# Patient Record
Sex: Female | Born: 1985 | Race: White | Hispanic: No | Marital: Single | Smoking: Never smoker
Health system: Southern US, Community
[De-identification: ages and names within clinical notes are randomized; demographics above are authoritative.]

## PROBLEM LIST (undated history)

## (undated) DIAGNOSIS — F32A Depression, unspecified: Secondary | ICD-10-CM

## (undated) DIAGNOSIS — D279 Benign neoplasm of unspecified ovary: Secondary | ICD-10-CM

## (undated) DIAGNOSIS — F329 Major depressive disorder, single episode, unspecified: Secondary | ICD-10-CM

## (undated) DIAGNOSIS — E7800 Pure hypercholesterolemia, unspecified: Secondary | ICD-10-CM

## (undated) HISTORY — DX: Pure hypercholesterolemia, unspecified: E78.00

## (undated) HISTORY — DX: Benign neoplasm of unspecified ovary: D27.9

## (undated) HISTORY — PX: PELVIC LAPAROSCOPY: SHX162

## (undated) HISTORY — DX: Depression, unspecified: F32.A

## (undated) HISTORY — DX: Major depressive disorder, single episode, unspecified: F32.9

---
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
## 2006-09-05 ENCOUNTER — Other Ambulatory Visit: Admission: RE | Admit: 2006-09-05 | Discharge: 2006-09-05 | Payer: Self-pay | Admitting: Gynecology

## 2008-02-05 ENCOUNTER — Other Ambulatory Visit: Admission: RE | Admit: 2008-02-05 | Discharge: 2008-02-05 | Payer: Self-pay | Admitting: Gynecology

## 2009-03-22 ENCOUNTER — Encounter: Payer: Self-pay | Admitting: Gynecology

## 2009-03-22 ENCOUNTER — Ambulatory Visit: Payer: Self-pay | Admitting: Gynecology

## 2009-03-22 ENCOUNTER — Other Ambulatory Visit: Admission: RE | Admit: 2009-03-22 | Discharge: 2009-03-22 | Payer: Self-pay | Admitting: Gynecology

## 2009-04-07 ENCOUNTER — Encounter: Admission: RE | Admit: 2009-04-07 | Discharge: 2009-04-07 | Payer: Self-pay | Admitting: Family Medicine

## 2010-04-11 ENCOUNTER — Other Ambulatory Visit: Admission: RE | Admit: 2010-04-11 | Discharge: 2010-04-11 | Payer: Self-pay | Admitting: Gynecology

## 2010-04-11 ENCOUNTER — Ambulatory Visit: Payer: Self-pay | Admitting: Gynecology

## 2010-04-12 ENCOUNTER — Ambulatory Visit: Payer: Self-pay | Admitting: Gynecology

## 2011-04-19 NOTE — H&P (Signed)
NAME:  Tiffany Mills, Tiffany Mills                        ACCOUNT NO.:  [id number]   MEDICAL RECORD NO.:  [id number]                   PATIENT TYPE:  AMB   LOCATION:  DAY                                  FACILITY:  [facility]   PHYSICIAN:  Juan H. Lily Peer, M.D.             DATE OF BIRTH:  Aug 07, 1986   DATE OF ADMISSION:  DATE OF DISCHARGE:                                HISTORY & PHYSICAL   CHIEF COMPLAINT:  Persistent right adnexal mass.   HISTORY OF PRESENT ILLNESS:  The patient is a 25 year old gravida 0, who had  been seen in [facility] on December 22, 2002, as a second  opinion, but decided later to stay with our practice.  She had been  evaluated first by her family practitioner at [facility]  due to the fact the patient had ongoing chronic anovulation with having  periods one to two per year and with some hair growth, and making one  suspicious for underlying polycystic ovarian disease.  Her workup at that  point had consisted of a slightly elevated SGOT, SGPT, cholesterol, and  triglycerides.  She had in a fasting state repeated on October 19, 2002.  Her __________ and glucose were also elevated.  Her FSH and LH were normal.  She had a pelvic ultrasound which demonstrated at that point that she had a  3 cm cyst in the right ovary, the left ovary was normal.  The ultrasound of  the abdomen had demonstrated some findings consistent with fatty  infiltration versus diffuse hepatocellular disease can not be ruled out.  She subsequently had seen Dr. Randa Evens, gastroenterologist, who did not find  any contraindications for her mild fatty liver disease to put her on oral  contraceptive pill in an effort to regulate her cycle, and perhaps help  decrease this cyst and break the ongoing cycle for hirsutism.  She had been  referred to another gynecologist in the community where by he had initiated  some studies to include 17-hydroxyprogesterone which was reported to  be in  the upper limits of normal range. TSH and prolactin had been normal.  Her  DHEA had been reported to be normal.  She had been started on Metformin 500  mg b.i.d. which currently her sugars are being monitored by her family  practice doctor.  Initially, she had an elevated CA-125 which had been 41.7,  and the most recent one on February 07, 2003, had decreased to a normal range of  23.1.  Her cysts were followed in our office in May and June 2004,  respectively, with the last ultrasound demonstrating a normal sized uterus,  the right adnexa rim of the ovarian tissue measuring 3.3 x 3.2 x 2.6 cm with  multiple follicles, less then 4 mm, a thin walled cyst measuring 4.1 x 4.0 x  3.6 cm with bright echogenic focus wall of the cyst measuring 9 x 3 mm.  There was negative color flow Doppler.  The inferior wall of the cystic mass  was a 21 x 13 x 15 mm irregularly shaped echogenic focus with shadowing,  again negative for color flow Doppler.  The left ovary was normal.  The cul-  de-sac was negative.  The working diagnosis was possible dermoid cyst versus  an endometrioma.   PAST MEDICAL HISTORY:  1. Polycystic ovarian disease.  2. Type 2, possibly type 1 diabetes.  3. Fatty liver.  4. Chronic anovulation.   ALLERGIES:  No known drug allergies.   Menarche started at age 75.   FAMILY HISTORY:  Grandmother with a history of diabetes as well as colon  cancer.  Two aunts with breast cancer.   PHYSICAL EXAMINATION:  VITAL SIGNS:  The patient is 5 feet 4.5 inches tall,  214 pounds.  Blood pressure 108/62.  GENERAL:  A well-developed, well-nourished female.  HEENT:  Unremarkable.  NECK:  Supple, trachea midline, no carotid bruits, no thyromegaly.  LUNGS:  Clear to auscultation without rhonchi or wheezes.  HEART:  Regular rate and rhythm.  No murmurs or gallops.  BREASTS:  Breast examination not done.  ABDOMEN:  Soft, nontender, without rebound or guarding.  PELVIC:  Not done due to  findings on ultrasound were sufficient.   ASSESSMENT:  A 24 year old gravida 0, with persistent right ovarian cyst,  history of chronic anovulation/polycystic ovarian disease/insulin  resistance.  Currently on Metformin 500 mg b.i.d.  Mild history of fatty  liver, followed by Dr. Randa Evens, no contraindications to hormonal regulation  of her menses.  She had been started on Ortho-Evra transdermal patch to  bypass the liver initially.  The patient sustained and has had regular  cycles at this point, but her cyst has remained persistent.   The patient is scheduled to undergo diagnostic laparoscopy with possible  right ovarian cystectomy, possible right salpingo-oophorectomy, possible  right oophorectomy.  The risks and benefits of the procedure were discussed  with the patient and her parents who were both present during the office  visit, to include infection, bleeding requiring blood transfusion with a  potential risk of anaphylactic reaction, hepatitis, and AIDS, the risk of  trauma to internal organs requiring open laparotomy or also in the event of  inaccessibility or difficulty in gaining access in completing the operation  laparoscopically, and open laparotomy technique could need to be utilized  which would require additional hospitalization dates.  She will receive  prophylaxis antibiotics.  We will continue to monitor her sugars.  All these  issues above we discussed with the patient.  In the event that this is a  malignancy, we will proceed with an unilateral adnexectomy and then refer  her to a gynecological oncologist for definitive treatment.  All of these  issues were discussed with the patient and her parents, and all questions  were answered.  We will follow accordingly.   PLAN:  As per assessment above.                                               Juan H. Lily Peer, M.D.    JHF/MEDQ  D:  05/09/2003  T:  05/09/2003  Job:  [id number]

## 2011-04-19 NOTE — Op Note (Signed)
NAME:  Tiffany Mills, Tiffany Mills                        ACCOUNT NO.:  [id number]   MEDICAL RECORD NO.:  [id number]                   PATIENT TYPE:  AMB   LOCATION:  DAY                                  FACILITY:  [facility]   PHYSICIAN:  Juan H. Lily Peer, M.D.             DATE OF BIRTH:  27-Oct-1986   DATE OF PROCEDURE:  05/10/2003  DATE OF DISCHARGE:                                 OPERATIVE REPORT   SURGEON:  Juan H. Lily Peer, M.D.   FIRST ASSISTANT:  Timothy P. Fontaine, M.D.   INDICATIONS FOR PROCEDURE:  A 25 year old with persistent right adnexal  mass.   PREOPERATIVE DIAGNOSIS:  Right pelvic mass.   POSTOPERATIVE DIAGNOSIS:  Right pelvic mass.   ANESTHESIA:  General endotracheal anesthesia.   PROCEDURE:  Laparoscopic right ovarian cystectomy, partial oophorectomy,  pelvic washings.   FINDINGS:  A 4 cm smooth right ovarian cyst, papillations were noted inside  the cyst wall. Normal tube and left ovary and normal pelvis.   DESCRIPTION OF PROCEDURE:  After the patient was adequately counseled, she  was taken to the operating room where she underwent a successful general  endotracheal anesthesia. A Foley catheter was inserted in an effort to  monitor urinary output. A Hulka tenaculum was placed in an effort to  manipulate the uterus during the laparoscopic procedure. After the drapes  were in place, a small stab incision was made in the infraumbilical region  followed by insertion of the Veress needle and subsequently by insertion of  the trocar, the trocar was removed. The laparoscopic sleeve was left in  place and the 10 mm scope was inserted. Two separate 5 mm trocar sites were  made approximately 4 fingerbreadths to the midline under laparoscopic  guidance. A systematic inspection of the pelvic cavity demonstrated smooth  peritoneum, normal appearing uterus, left tube and ovary, right tube was  normal. A 4 cm smooth large ovarian cyst was noted. Also the cul-de-sac was  smooth  and normal and no evidence of any adhesions or endometriotic  implants. At this point in the operation, the ovary was placed under tension  and a needle was inserted into the cyst and the cyst contents were aspirated  and sent for cytologic evaluation. Also pelvic washings had previously been  obtained and submitted for cytological evaluation as well. Once the cyst  wall collapsed, the cyst wall was incised into the capsule and it was noted  at this point to some excrescence were noted in the inferior portion of the  inside ovary. A partial oophorectomy was accomplished. The cyst was  difficult to separate from the cyst wall. This was submitted for frozen. The  preliminary report was possible fibroma and possible borderline tumor but no  clear cut evidence of malignancy according to Dr. Army Melia, pathologist, who  gave a preliminary report. Additional portions of the ovary were removed  leaving a small portion of right cortex left in  place. The base was  cauterized, the pelvic cavity was copiously irrigated with normal saline  solution. The pneumoperitoneum was removed and the instruments were removed.  The subumbilical 10 mm trocar site fascia was closed with a 3-0 Vicryl  suture and the skin was reapproximated with a subcuticular stitch of 4-0  plain catgut suture. Both 5 mm trocar sites on the skin was reapproximated  with 4-0 plain catgut suture. For postoperative analgesia,  0.25% Marcaine was infiltrated into all three incision sites for a total of  10 mL. The Hulka tenaculum was removed. She did receive 1 g of Cefotan  prophylactically and the Foley catheter was removed. Urine output at the end  of the case was 275 mL and EBL was less than 100 mL and IV fluids was 1250  mL.                                               Juan H. Lily Peer, M.D.    JHF/MEDQ  D:  05/10/2003  T:  05/10/2003  Job:  [id number]

## 2011-05-21 ENCOUNTER — Encounter (INDEPENDENT_AMBULATORY_CARE_PROVIDER_SITE_OTHER): Payer: Managed Care, Other (non HMO) | Admitting: Gynecology

## 2011-05-21 ENCOUNTER — Other Ambulatory Visit (HOSPITAL_COMMUNITY)
Admission: RE | Admit: 2011-05-21 | Discharge: 2011-05-21 | Disposition: A | Discharge status: Home / Self Care | Payer: Managed Care, Other (non HMO) | Source: Ambulatory Visit | Attending: Gynecology | Admitting: Gynecology

## 2011-05-21 ENCOUNTER — Other Ambulatory Visit: Payer: Self-pay | Admitting: Gynecology

## 2011-05-21 DIAGNOSIS — Z124 Encounter for screening for malignant neoplasm of cervix: Secondary | ICD-10-CM | POA: Insufficient documentation

## 2011-05-21 DIAGNOSIS — R635 Abnormal weight gain: Secondary | ICD-10-CM

## 2011-05-21 DIAGNOSIS — N83209 Unspecified ovarian cyst, unspecified side: Secondary | ICD-10-CM

## 2011-05-21 DIAGNOSIS — R82998 Other abnormal findings in urine: Secondary | ICD-10-CM

## 2011-05-21 DIAGNOSIS — Z01419 Encounter for gynecological examination (general) (routine) without abnormal findings: Secondary | ICD-10-CM

## 2012-05-06 ENCOUNTER — Telehealth: Payer: Self-pay | Admitting: *Deleted

## 2012-05-06 DIAGNOSIS — Z87898 Personal history of other specified conditions: Secondary | ICD-10-CM

## 2012-05-06 MED ORDER — NORGESTIMATE-ETH ESTRADIOL 0.25-35 MG-MCG PO TABS: 1.0000 | ORAL_TABLET | Freq: Every day | ORAL | Status: DC

## 2012-05-06 NOTE — Telephone Encounter (Signed)
Per JF verbral order okay for pt to have ultrasound done, rx will be sent to pharmacy for 1 pack of pills

## 2012-05-06 NOTE — Telephone Encounter (Signed)
Pt has annual scheduled on July 22, she would like know if ultrasound will be done this year as well? Pt said the last couple of annual had an ultrasound done due right ovarian tumor. Please advise

## 2012-06-22 ENCOUNTER — Encounter: Payer: Managed Care, Other (non HMO) | Admitting: Gynecology

## 2012-06-24 ENCOUNTER — Encounter: Payer: Self-pay | Admitting: *Deleted

## 2012-06-24 ENCOUNTER — Other Ambulatory Visit: Payer: Managed Care, Other (non HMO)

## 2012-06-24 ENCOUNTER — Encounter: Payer: Managed Care, Other (non HMO) | Admitting: Gynecology

## 2012-06-24 NOTE — Progress Notes (Signed)
Patient ID: Tiffany Mills, female   DOB: Dec 24, 1985, 26 y.o.   MRN: [id number] PT CALLED ASKING IF OV TODAY WITH U/S SHOULD BE RESCHEDULED DUE TO CYCLE STARTING TODAY. LEFT MESSAGE ON PT VOICEMAIL THE DECISION IS UP TO HER, PAP MAY NOT BE DONE DUE TO BLEEDING. TOLD PT TO CALL IF RESCHEDULE.

## 2012-06-25 ENCOUNTER — Other Ambulatory Visit: Payer: Managed Care, Other (non HMO)

## 2012-06-25 ENCOUNTER — Encounter: Payer: Managed Care, Other (non HMO) | Admitting: Gynecology

## 2012-07-05 ENCOUNTER — Other Ambulatory Visit: Payer: Self-pay | Admitting: Gynecology

## 2012-07-09 ENCOUNTER — Ambulatory Visit (INDEPENDENT_AMBULATORY_CARE_PROVIDER_SITE_OTHER): Payer: Commercial Managed Care - PPO

## 2012-07-09 ENCOUNTER — Ambulatory Visit (INDEPENDENT_AMBULATORY_CARE_PROVIDER_SITE_OTHER): Payer: Commercial Managed Care - PPO | Admitting: Gynecology

## 2012-07-09 ENCOUNTER — Encounter: Payer: Self-pay | Admitting: Gynecology

## 2012-07-09 VITALS — BP 120/86 | Ht 64.0 in | Wt 241.0 lb

## 2012-07-09 DIAGNOSIS — Z87898 Personal history of other specified conditions: Secondary | ICD-10-CM

## 2012-07-09 DIAGNOSIS — D279 Benign neoplasm of unspecified ovary: Secondary | ICD-10-CM

## 2012-07-09 DIAGNOSIS — Z01419 Encounter for gynecological examination (general) (routine) without abnormal findings: Secondary | ICD-10-CM

## 2012-07-09 DIAGNOSIS — I10 Essential (primary) hypertension: Secondary | ICD-10-CM | POA: Insufficient documentation

## 2012-07-09 DIAGNOSIS — E7800 Pure hypercholesterolemia, unspecified: Secondary | ICD-10-CM | POA: Insufficient documentation

## 2012-07-09 DIAGNOSIS — Z309 Encounter for contraceptive management, unspecified: Secondary | ICD-10-CM

## 2012-07-09 DIAGNOSIS — N8300 Follicular cyst of ovary, unspecified side: Secondary | ICD-10-CM

## 2012-07-09 DIAGNOSIS — E119 Type 2 diabetes mellitus without complications: Secondary | ICD-10-CM | POA: Insufficient documentation

## 2012-07-09 DIAGNOSIS — R638 Other symptoms and signs concerning food and fluid intake: Secondary | ICD-10-CM

## 2012-07-09 MED ORDER — NORGESTIMATE-ETH ESTRADIOL 0.25-35 MG-MCG PO TABS: 1.0000 | ORAL_TABLET | Freq: Every day | ORAL | Status: DC

## 2012-07-09 NOTE — Patient Instructions (Addendum)
Health Maintenance, Females A healthy lifestyle and preventative care can promote health and wellness.  Maintain regular health, dental, and eye exams.   Eat a healthy diet. Foods like vegetables, fruits, whole grains, low-fat dairy products, and lean protein foods contain the nutrients you need without too many calories. Decrease your intake of foods high in solid fats, added sugars, and salt. Get information about a proper diet from your caregiver, if necessary.   Regular physical exercise is one of the most important things you can do for your health. Most adults should get at least 150 minutes of moderate-intensity exercise (any activity that increases your heart rate and causes you to sweat) each week. In addition, most adults need muscle-strengthening exercises on 2 or more days a week.    Maintain a healthy weight. The body mass index (BMI) is a screening tool to identify possible weight problems. It provides an estimate of body fat based on height and weight. Your caregiver can help determine your BMI, and can help you achieve or maintain a healthy weight. For adults 20 years and older:   A BMI below 18.5 is considered underweight.   A BMI of 18.5 to 24.9 is normal.   A BMI of 25 to 29.9 is considered overweight.   A BMI of 30 and above is considered obese.   Maintain normal blood lipids and cholesterol by exercising and minimizing your intake of saturated fat. Eat a balanced diet with plenty of fruits and vegetables. Blood tests for lipids and cholesterol should begin at age 20 and be repeated every 5 years. If your lipid or cholesterol levels are high, you are over 50, or you are a high risk for heart disease, you may need your cholesterol levels checked more frequently.Ongoing high lipid and cholesterol levels should be treated with medicines if diet and exercise are not effective.   If you smoke, find out from your caregiver how to quit. If you do not use tobacco, do not start.    If you are pregnant, do not drink alcohol. If you are breastfeeding, be very cautious about drinking alcohol. If you are not pregnant and choose to drink alcohol, do not exceed 1 drink per day. One drink is considered to be 12 ounces (355 mL) of beer, 5 ounces (148 mL) of wine, or 1.5 ounces (44 mL) of liquor.   Avoid use of street drugs. Do not share needles with anyone. Ask for help if you need support or instructions about stopping the use of drugs.   High blood pressure causes heart disease and increases the risk of stroke. Blood pressure should be checked at least every 1 to 2 years. Ongoing high blood pressure should be treated with medicines, if weight loss and exercise are not effective.   If you are 55 to 25 years old, ask your caregiver if you should take aspirin to prevent strokes.   Diabetes screening involves taking a blood sample to check your fasting blood sugar level. This should be done once every 3 years, after age 45, if you are within normal weight and without risk factors for diabetes. Testing should be considered at a younger age or be carried out more frequently if you are overweight and have at least 1 risk factor for diabetes.   Breast cancer screening is essential preventative care for women. You should practice "breast self-awareness." This means understanding the normal appearance and feel of your breasts and may include breast self-examination. Any changes detected, no matter how   small, should be reported to a caregiver. Women in their 20s and 30s should have a clinical breast exam (CBE) by a caregiver as part of a regular health exam every 1 to 3 years. After age 40, women should have a CBE every year. Starting at age 40, women should consider having a mammogram (breast X-ray) every year. Women who have a family history of breast cancer should talk to their caregiver about genetic screening. Women at a high risk of breast cancer should talk to their caregiver about having  an MRI and a mammogram every year.   The Pap test is a screening test for cervical cancer. Women should have a Pap test starting at age 21. Between ages 21 and 29, Pap tests should be repeated every 2 years. Beginning at age 30, you should have a Pap test every 3 years as long as the past 3 Pap tests have been normal. If you had a hysterectomy for a problem that was not cancer or a condition that could lead to cancer, then you no longer need Pap tests. If you are between ages 65 and 70, and you have had normal Pap tests going back 10 years, you no longer need Pap tests. If you have had past treatment for cervical cancer or a condition that could lead to cancer, you need Pap tests and screening for cancer for at least 20 years after your treatment. If Pap tests have been discontinued, risk factors (such as a new sexual partner) need to be reassessed to determine if screening should be resumed. Some women have medical problems that increase the chance of getting cervical cancer. In these cases, your caregiver may recommend more frequent screening and Pap tests.   The human papillomavirus (HPV) test is an additional test that may be used for cervical cancer screening. The HPV test looks for the virus that can cause the cell changes on the cervix. The cells collected during the Pap test can be tested for HPV. The HPV test could be used to screen women aged 30 years and older, and should be used in women of any age who have unclear Pap test results. After the age of 30, women should have HPV testing at the same frequency as a Pap test.   Colorectal cancer can be detected and often prevented. Most routine colorectal cancer screening begins at the age of 50 and continues through age 75. However, your caregiver may recommend screening at an earlier age if you have risk factors for colon cancer. On a yearly basis, your caregiver may provide home test kits to check for hidden blood in the stool. Use of a small camera at  the end of a tube, to directly examine the colon (sigmoidoscopy or colonoscopy), can detect the earliest forms of colorectal cancer. Talk to your caregiver about this at age 50, when routine screening begins. Direct examination of the colon should be repeated every 5 to 10 years through age 75, unless early forms of pre-cancerous polyps or small growths are found.   Hepatitis C blood testing is recommended for all people born from 1945 through 1965 and any individual with known risks for hepatitis C.   Practice safe sex. Use condoms and avoid high-risk sexual practices to reduce the spread of sexually transmitted infections (STIs). Sexually active women aged 25 and younger should be checked for Chlamydia, which is a common sexually transmitted infection. Older women with new or multiple partners should also be tested for Chlamydia. Testing for other   STIs is recommended if you are sexually active and at increased risk.   Osteoporosis is a disease in which the bones lose minerals and strength with aging. This can result in serious bone fractures. The risk of osteoporosis can be identified using a bone density scan. Women ages 65 and over and women at risk for fractures or osteoporosis should discuss screening with their caregivers. Ask your caregiver whether you should be taking a calcium supplement or vitamin D to reduce the rate of osteoporosis.   Menopause can be associated with physical symptoms and risks. Hormone replacement therapy is available to decrease symptoms and risks. You should talk to your caregiver about whether hormone replacement therapy is right for you.   Use sunscreen with a sun protection factor (SPF) of 30 or greater. Apply sunscreen liberally and repeatedly throughout the day. You should seek shade when your shadow is shorter than you. Protect yourself by wearing long sleeves, pants, a wide-brimmed hat, and sunglasses year round, whenever you are outdoors.   Notify your caregiver  of new moles or changes in moles, especially if there is a change in shape or color. Also notify your caregiver if a mole is larger than the size of a pencil eraser.   Stay current with your immunizations.  Document Released: 06/03/2011 Document Revised: 11/07/2011 Document Reviewed: 06/03/2011 [employer] Patient Information 2012 [employer].                                                   Cholesterol Control Diet  Cholesterol levels in your body are determined significantly by your diet. Cholesterol levels may also be related to heart disease. The following material helps to explain this relationship and discusses what you can do to help keep your heart healthy. Not all cholesterol is bad. Low-density lipoprotein (LDL) cholesterol is the "bad" cholesterol. It may cause fatty deposits to build up inside your arteries. High-density lipoprotein (HDL) cholesterol is "good." It helps to remove the "bad" LDL cholesterol from your blood. Cholesterol is a very important risk factor for heart disease. Other risk factors are high blood pressure, smoking, stress, heredity, and weight. The heart muscle gets its supply of blood through the coronary arteries. If your LDL cholesterol is high and your HDL cholesterol is low, you are at risk for having fatty deposits build up in your coronary arteries. This leaves less room through which blood can flow. Without sufficient blood and oxygen, the heart muscle cannot function properly and you may feel chest pains (angina pectoris). When a coronary artery closes up entirely, a part of the heart muscle may die, causing a heart attack (myocardial infarction). CHECKING CHOLESTEROL When your caregiver sends your blood to a lab to be analyzed for cholesterol, a complete lipid (fat) profile may be done. With this test, the total amount of cholesterol and levels of LDL and HDL are determined. Triglycerides are a type of fat that circulates in the blood and can also be used to  determine heart disease risk. The list below describes what the numbers should be: Test: Total Cholesterol.  Less than 200 mg/dl.  Test: LDL "bad cholesterol."  Less than 100 mg/dl.   Less than 70 mg/dl if you are at very high risk of a heart attack or sudden cardiac death.  Test: HDL "good cholesterol."  Greater than 50 mg/dl for   women.   Greater than 40 mg/dl for men.  Test: Triglycerides.  Less than 150 mg/dl.  CONTROLLING CHOLESTEROL WITH DIET Although exercise and lifestyle factors are important, your diet is key. That is because certain foods are known to raise cholesterol and others to lower it. The goal is to balance foods for their effect on cholesterol and more importantly, to replace saturated and trans fat with other types of fat, such as monounsaturated fat, polyunsaturated fat, and omega-3 fatty acids. On average, a person should consume no more than 15 to 17 g of saturated fat daily. Saturated and trans fats are considered "bad" fats, and they will raise LDL cholesterol. Saturated fats are primarily found in animal products such as meats, butter, and cream. However, that does not mean you need to sacrifice all your favorite foods. Today, there are good tasting, low-fat, low-cholesterol substitutes for most of the things you like to eat. Choose low-fat or nonfat alternatives. Choose round or loin cuts of red meat, since these types of cuts are lowest in fat and cholesterol. Chicken (without the skin), fish, veal, and ground turkey breast are excellent choices. Eliminate fatty meats, such as hot dogs and salami. Even shellfish have little or no saturated fat. Have a 3 oz (85 g) portion when you eat lean meat, poultry, or fish. Trans fats are also called "partially hydrogenated oils." They are oils that have been scientifically manipulated so that they are solid at room temperature resulting in a longer shelf life and improved taste and texture of foods in which they are added. Trans  fats are found in stick margarine, some tub margarines, cookies, crackers, and baked goods.  When baking and cooking, oils are an excellent substitute for butter. The monounsaturated oils are especially beneficial since it is believed they lower LDL and raise HDL. The oils you should avoid entirely are saturated tropical oils, such as coconut and palm.  Remember to eat liberally from food groups that are naturally free of saturated and trans fat, including fish, fruit, vegetables, beans, grains (barley, rice, couscous, bulgur wheat), and pasta (without cream sauces).  IDENTIFYING FOODS THAT LOWER CHOLESTEROL  Soluble fiber may lower your cholesterol. This type of fiber is found in fruits such as apples, vegetables such as broccoli, potatoes, and carrots, legumes such as beans, peas, and lentils, and grains such as barley. Foods fortified with plant sterols (phytosterol) may also lower cholesterol. You should eat at least 2 g per day of these foods for a cholesterol lowering effect.  Read package labels to identify low-saturated fats, trans fats free, and low-fat foods at the supermarket. Select cheeses that have only 2 to 3 g saturated fat per ounce. Use a heart-healthy tub margarine that is free of trans fats or partially hydrogenated oil. When buying baked goods (cookies, crackers), avoid partially hydrogenated oils. Breads and muffins should be made from whole grains (whole-wheat or whole oat flour, instead of "flour" or "enriched flour"). Buy non-creamy canned soups with reduced salt and no added fats.  FOOD PREPARATION TECHNIQUES  Never deep-fry. If you must fry, either stir-fry, which uses very little fat, or use non-stick cooking sprays. When possible, broil, bake, or roast meats, and steam vegetables. Instead of dressing vegetables with butter or margarine, use lemon and herbs, applesauce and cinnamon (for squash and sweet potatoes), nonfat yogurt, salsa, and low-fat dressings for salads.    LOW-SATURATED FAT / LOW-FAT FOOD SUBSTITUTES Meats / Saturated Fat (g)  Avoid: Steak, marbled (3 oz/85 g) /   11 g   Choose: Steak, lean (3 oz/85 g) / 4 g   Avoid: Hamburger (3 oz/85 g) / 7 g   Choose: Hamburger, lean (3 oz/85 g) / 5 g   Avoid: Ham (3 oz/85 g) / 6 g   Choose: Ham, lean cut (3 oz/85 g) / 2.4 g   Avoid: Chicken, with skin, dark meat (3 oz/85 g) / 4 g   Choose: Chicken, skin removed, dark meat (3 oz/85 g) / 2 g   Avoid: Chicken, with skin, light meat (3 oz/85 g) / 2.5 g   Choose: Chicken, skin removed, light meat (3 oz/85 g) / 1 g  Dairy / Saturated Fat (g)  Avoid: Whole milk (1 cup) / 5 g   Choose: Low-fat milk, 2% (1 cup) / 3 g   Choose: Low-fat milk, 1% (1 cup) / 1.5 g   Choose: Skim milk (1 cup) / 0.3 g   Avoid: Hard cheese (1 oz/28 g) / 6 g   Choose: Skim milk cheese (1 oz/28 g) / 2 to 3 g   Avoid: Cottage cheese, 4% fat (1 cup) / 6.5 g   Choose: Low-fat cottage cheese, 1% fat (1 cup) / 1.5 g   Avoid: Ice cream (1 cup) / 9 g   Choose: Sherbet (1 cup) / 2.5 g   Choose: Nonfat frozen yogurt (1 cup) / 0.3 g   Choose: Frozen fruit bar / trace   Avoid: Whipped cream (1 tbs) / 3.5 g   Choose: Nondairy whipped topping (1 tbs) / 1 g  Condiments / Saturated Fat (g)  Avoid: Mayonnaise (1 tbs) / 2 g   Choose: Low-fat mayonnaise (1 tbs) / 1 g   Avoid: Butter (1 tbs) / 7 g   Choose: Extra light margarine (1 tbs) / 1 g   Avoid: Coconut oil (1 tbs) / 11.8 g   Choose: Olive oil (1 tbs) / 1.8 g   Choose: Corn oil (1 tbs) / 1.7 g   Choose: Safflower oil (1 tbs) / 1.2 g   Choose: Sunflower oil (1 tbs) / 1.4 g   Choose: Soybean oil (1 tbs) / 2.4 g   Choose: Canola oil (1 tbs) / 1 g  Document Released: 11/18/2005 Document Revised: 07/31/2011 Document Reviewed: 05/09/2011 [employer] Patient Information 2012 [employer].  Exercise to Lose Weight Exercise and a healthy diet may help you lose weight. Your doctor may suggest specific  exercises. EXERCISE IDEAS AND TIPS  Choose low-cost things you enjoy doing, such as walking, bicycling, or exercising to workout videos.   Take stairs instead of the elevator.   Walk during your lunch break.   Park your car further away from work or school.   Go to a gym or an exercise class.   Start with 5 to 10 minutes of exercise each day. Build up to 30 minutes of exercise 4 to 6 days a week.   Wear shoes with good support and comfortable clothes.   Stretch before and after working out.   Work out until you breathe harder and your heart beats faster.   Drink extra water when you exercise.   Do not do so much that you hurt yourself, feel dizzy, or get very short of breath.  Exercises that burn about 150 calories:  Running 1  miles in 15 minutes.   Playing volleyball for 45 to 60 minutes.   Washing and waxing a car for 45 to 60 minutes.   Playing touch football   for 45 minutes.   Walking 1  miles in 35 minutes.   Pushing a stroller 1  miles in 30 minutes.   Playing basketball for 30 minutes.   Raking leaves for 30 minutes.   Bicycling 5 miles in 30 minutes.   Walking 2 miles in 30 minutes.   Dancing for 30 minutes.   Shoveling snow for 15 minutes.   Swimming laps for 20 minutes.   Walking up stairs for 15 minutes.   Bicycling 4 miles in 15 minutes.   Gardening for 30 to 45 minutes.   Jumping rope for 15 minutes.   Washing windows or floors for 45 to 60 minutes.  Document Released: 12/21/2010 Document Revised: 07/31/2011 Document Reviewed: 12/21/2010 [employer] Patient Information 2012 [employer].  

## 2012-07-09 NOTE — Progress Notes (Signed)
Tiffany Mills Apr 03, 1986 [id number]   History:    26 y.o.  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 She's currently on Sprintec 28 day oral contraceptive pill. She's has been followed at [facility] for type 2 diabetes, hypercholesterolemia, and hypertension. She reports her cycles are regular and she frequently does her self breast examination and has had not had the Gardasil Vaccine. She has lost 6 pounds is last year but her BMI is still 41.3. She states she started to exercise more regularly. She's currently not sexually active.  Ultrasound today: Uterus measures 7.3 x 4.6 x 3.0 cm with a 6.4 mm endometrial stripe. Right ovary normal with several follicles left ovary dominant follicle echo-free measuring 26 x 21 x 20 mm (patient with a 14 her cycle) no apparent masses seen on right or left adnexa.  Past medical history,surgical history, family history and social history were all reviewed and documented in the EPIC chart.  Gynecologic History Patient's last menstrual period was 06/24/2012. Contraception: OCP (estrogen/progesterone) Last Pap: 2012. Results were: normal Last mammogram: Not indicated. Results were: Not indicated  Obstetric History OB History    Grav Para Term Preterm Abortions TAB SAB Ect Mult Living   0                ROS: A ROS was performed and pertinent positives and negatives are included in the history.  GENERAL: No fevers or chills. HEENT: No change in vision, no earache, sore throat or sinus congestion. NECK: No pain or stiffness. CARDIOVASCULAR: No chest pain or pressure. No palpitations. PULMONARY: No shortness of breath,  cough or wheeze. GASTROINTESTINAL: No abdominal pain, nausea, vomiting or diarrhea, melena or bright red blood per rectum. GENITOURINARY: No urinary frequency, urgency, hesitancy or dysuria. MUSCULOSKELETAL: No joint or muscle pain, no back pain, no recent trauma. DERMATOLOGIC: No rash, no itching, no lesions. ENDOCRINE: No polyuria, polydipsia, no heat or cold intolerance. No recent change in weight. HEMATOLOGICAL: No anemia or easy bruising or bleeding. NEUROLOGIC: No headache, seizures, numbness, tingling or weakness. PSYCHIATRIC: No depression, no loss of interest in normal activity or change in sleep pattern.     Exam: chaperone present  BP 120/86  Ht 5\' 4"  (1.626 m)  Wt 241 lb (109.317 kg)  BMI 41.37 kg/m2  LMP 06/24/2012  Body mass index is 41.37 kg/(m^2).  General appearance : Well developed well nourished female. No acute distress HEENT: Neck supple, trachea midline, no carotid bruits, no thyroidmegaly Lungs: Clear to auscultation, no rhonchi or wheezes, or rib retractions  Heart: Regular rate and rhythm, no murmurs or gallops Breast:Examined in sitting and supine position were symmetrical in appearance, no palpable masses or tenderness,  no skin retraction, no nipple inversion, no nipple discharge, no skin discoloration, no axillary or supraclavicular lymphadenopathy Abdomen: no palpable masses or tenderness, no rebound or guarding Extremities: no edema or skin discoloration or tenderness  Pelvic:  Bartholin, Urethra, Skene Glands: Within normal limits             Vagina: No gross lesions or discharge  Cervix: No gross lesions or discharge  Uterus  anteverted, normal size, shape and consistency, non-tender and mobile  Adnexa  Without masses  or tenderness  Anus and perineum  normal   Rectovaginal  normal sphincter tone without palpated masses or tenderness             Hemoccult not done     Assessment/Plan:  26 y.o. female for annual exam  With past history of right ovarian  SEROUS CYSTADENOFIBROMA in 2004 with no evidence of recurrence. Patient had a normal ultrasound today. She will stop by the lab to check a CA 125. We did discuss the new Pap smear screening guidelines and she would not need a Pap smear this year. Patient not sexually active. She was encouraged to do her monthly self breast examination. Literature information on cholesterol-lowering diet was provided along with an exercise protocol.    Ok Edwards MD, 3:39 PM 07/09/2012

## 2013-06-02 ENCOUNTER — Other Ambulatory Visit: Payer: Self-pay | Admitting: Gynecology

## 2013-06-02 DIAGNOSIS — N838 Other noninflammatory disorders of ovary, fallopian tube and broad ligament: Secondary | ICD-10-CM

## 2013-07-15 ENCOUNTER — Encounter: Payer: Commercial Managed Care - PPO | Admitting: Gynecology

## 2013-07-15 ENCOUNTER — Other Ambulatory Visit: Payer: Commercial Managed Care - PPO

## 2013-07-16 ENCOUNTER — Encounter: Payer: Self-pay | Admitting: Gynecology

## 2013-07-16 ENCOUNTER — Ambulatory Visit (INDEPENDENT_AMBULATORY_CARE_PROVIDER_SITE_OTHER): Payer: Commercial Managed Care - PPO

## 2013-07-16 ENCOUNTER — Ambulatory Visit (INDEPENDENT_AMBULATORY_CARE_PROVIDER_SITE_OTHER): Payer: Commercial Managed Care - PPO | Admitting: Gynecology

## 2013-07-16 VITALS — BP 112/72 | Ht 64.0 in | Wt 241.0 lb

## 2013-07-16 DIAGNOSIS — Z01419 Encounter for gynecological examination (general) (routine) without abnormal findings: Secondary | ICD-10-CM

## 2013-07-16 DIAGNOSIS — Z8742 Personal history of other diseases of the female genital tract: Secondary | ICD-10-CM

## 2013-07-16 DIAGNOSIS — N838 Other noninflammatory disorders of ovary, fallopian tube and broad ligament: Secondary | ICD-10-CM

## 2013-07-16 DIAGNOSIS — Z309 Encounter for contraceptive management, unspecified: Secondary | ICD-10-CM

## 2013-07-16 DIAGNOSIS — N839 Noninflammatory disorder of ovary, fallopian tube and broad ligament, unspecified: Secondary | ICD-10-CM

## 2013-07-16 MED ORDER — NORGESTIMATE-ETH ESTRADIOL 0.25-35 MG-MCG PO TABS: 1.0000 | ORAL_TABLET | Freq: Every day | ORAL | Status: AC

## 2013-07-16 NOTE — Patient Instructions (Signed)
HPV Vaccine Questions and Answers WHAT IS HUMAN PAPILLOMAVIRUS (HPV)? HPV is a virus that can lead to cervical cancer; vulvar and vaginal cancers; penile cancer; anal cancer and genital warts (warts in the genital areas). More than 1 vaccine is available to help you or your child with protection against HPV. Your caregiver can talk to you about which one might give you the best protection. WHO SHOULD GET THIS VACCINE? The HPV vaccine is most effective when given before the onset of sexual activity.  This vaccine is recommended for girls 26 or 26 years of age. It can be given to girls as young as 26 years old.  HPV vaccine can be given to males, 9 through 26 years of age, to reduce the likelihood of acquiring genital warts.  HPV vaccine can be given to males and females aged 51 through 26 years to prevent anal cancer. HPV vaccine is not generally recommended after age 26, because most individuals have been exposed to the HPV virus by that age. HOW EFFECTIVE IS THIS VACCINE?  The vaccine is generally effective in preventing cervical; vulvar and vaginal cancers; penile cancer; anal cancer and genital warts caused by 4 types of HPV. The vaccine is less effective in those individuals who are already infected with HPV. This vaccine does not treat existing HPV, genital warts, pre-cancers or cancers. WILL SEXUALLY ACTIVE INDIVIDUALS BENEFIT FROM THE VACCINE? Sexually active individuals may still benefit from the vaccine but may get less benefit due to previous HPV exposure. HOW AND WHEN IS THE VACCINE ADMINISTERED? The vaccine is given in a series of 3 injections (shots) over a 6 month period in both males and females. The exact timing depends on which specific vaccine your caregiver recommends for you. IS THE HPV VACCINE SAFE?  The federal government has approved the HPV vaccine as safe and effective. This vaccine was tested in both males and females in many countries around the world. The most common  side effect is soreness at the injection site. Since the drug became approved, there has been some concern about patients passing out after being vaccinated, which has led to a recommendation of a 15 minute waiting period following vaccination. This practice may decrease the small risk of passing out. Additionally there is a rare risk of anaphylaxis (an allergic reaction) to the vaccine and a risk of a blood clot among individuals with specific risk factors for a blood clot. DOES THIS VACCINE CONTAIN THIMEROSAL OR MERCURY? No. There is no thimerosal or mercury in the HPV vaccine. It is made of proteins from the outer coat of the virus (HPV). There is no infectious material in this vaccine. WILL GIRLS/WOMEN WHO HAVE BEEN VACCINATED STILL NEED CERVICAL CANCER SCREENING? Yes. There are 3 reasons why women will still need regular cervical cancer screening. First, the vaccine will NOT provide protection against all types of HPV that cause cervical cancer. Vaccinated women will still be at risk for some cancers. Second, some women may not get all required doses of the vaccine (or they may not get them at the recommended times). Therefore, they may not get the vaccine's full benefits. Third, women may not get the full benefit of the vaccine if they receive it after they have already acquired any of the 4 types of HPV. WILL THE HPV VACCINE BE COVERED BY INSURANCE PLANS? While some insurance companies may cover the vaccine, others may not. Most large group insurance plans cover the costs of recommended vaccines. WHAT KIND OF GOVERNMENT PROGRAMS  MAY BE AVAILABLE TO COVER HPV VACCINE? Federal health programs such as Vaccines for Children [facility]) will cover the HPV vaccine. The [facility] program provides free vaccines to children and adolescents under 26 years of age, who are either uninsured, Medicaid-eligible, American [place] or [place] Native. There are over 45,000 sites that provide [facility] vaccines including hospital, private  and public clinics. The [facility] program also allows children and adolescents to get VFC vaccines through [facility] or [facility] if their private health insurance does not cover the vaccine. Some states also provide free or low-cost vaccines, at public health clinics, to people without health insurance coverage for vaccines. GENITAL HPV: WHY IS HPV IMPORTANT? Genital HPV is the most common virus transmitted through genital contact, most often during vaginal and anal sex. About 40 types of HPV can infect the genital areas of men and women. While most HPV types cause no symptoms and go away on their own, some types can cause cervical cancer in women. These types also cause other less common genital cancers, including cancers of the penis, anus, vagina (birth canal), and vulva (area around the opening of the vagina). Other types of HPV can cause genital warts in men and women. HOW COMMON IS HPV?   At least 50% of sexually active people will get HPV at some time in their lives. HPV is most common in young women and men who are in their late teens and early 26s.  Anyone who has ever had genital contact with another person can get HPV. Both men and women can get it and pass it on to their sex partners without realizing it. IS HPV THE SAME THING AS HIV OR HERPES? HPV is NOT the same as HIV or Herpes (Herpes simplex virus or HSV). While these are all viruses that can be sexually transmitted, HIV and HSV do not cause the same symptoms or health problems as HPV. CAN HPV AND ITS ASSOCIATED DISEASES BE TREATED? There is no treatment for HPV. There are treatments for the health problems that HPV can cause, such as genital warts, cervical cell changes, and cancers of the cervix (lower part of the womb), vulva, vagina and anus.  HOW IS HPV RELATED TO CERVICAL CANCER? Some types of HPV can infect a woman's cervix and cause the cells to change in an abnormal way. Most of the time, HPV goes  away on its own. When HPV is gone, the cervical cells go back to normal. Sometimes, HPV does not go away. Instead, it lingers (persists) and continues to change the cells on a woman's cervix. These cell changes can lead to cancer over time if they are not treated. ARE THERE OTHER WAYS TO PREVENT CERVICAL CANCER? Regular Pap tests and follow-up can prevent most, but not all, cases of cervical cancer. Pap tests can detect cell changes (or pre-cancers) in the cervix before they turn into cancer. Pap tests can also detect most, but not all, cervical cancers at an early, curable stage. Most women diagnosed with cervical cancer have either never had a Pap test, or not had a Pap test in the last 5 years. There is also an HPV DNA test available for use with the Pap test as part of cervical cancer screening. This test may be ordered for women over 30 or for women who get an unclear (borderline) Pap test result. While this test can tell if a woman has HPV on her cervix, it cannot tell which types of HPV she has.  If the HPV DNA test is negative for HPV DNA, then screening may be done every 3 years. If the HPV DNA test is positive for HPV DNA, then screening should be done every 6 to 12 months. OTHER QUESTIONS ABOUT THE HPV VACCINE WHAT HPV TYPES DOES THE VACCINE PROTECT AGAINST? The HPV vaccine protects against the HPV types that cause most (70%) cervical cancers (types 16 and 18), most (78%) anal cancers (types 16 and 18) and the two HPV types that cause most (90%) genital warts (types 6 and 11). WHAT DOES THE VACCINE NOT PROTECT AGAINST?  Because the vaccine does not protect against all types of HPV, it will not prevent all cases of cervical cancer, anal cancer, other genital cancers or genital warts. About 30% of cervical cancers are not prevented with vaccination, so it will be important for women to continue screening for cervical cancer (regular Pap tests). Also, the vaccine does not prevent about 10% of genital  warts nor will it prevent other sexually transmitted infections (STIs), including HIV. Therefore, it will still be important for sexually active adults to practice safe sex to reduce exposure to HPV and other STI's. HOW LONG DOES VACCINE PROTECTION LAST? WILL A BOOSTER SHOT BE NEEDED? So far, studies have followed women for 5 years and found that they are still protected. Currently, additional (booster) doses are not recommended. More research is being done to find out how long protection will last, and if a booster vaccine is needed years later.  WHY IS THE HPV VACCINE RECOMMENDED AT SUCH A YOUNG AGE? Ideally, males and females should get the vaccine before they are sexually active since this vaccine is most effective in individuals who have not yet acquired any of the HPV vaccine types. Individuals who have not been infected with any of the 4 types of HPV will get the full benefits of the vaccine.  SHOULD PREGNANT WOMEN BE VACCINATED? The vaccine is not recommended for pregnant women. There has been limited research looking at vaccine safety for pregnant women and their developing fetus. Studies suggest that the vaccine has not caused health problems during pregnancy, nor has it caused health problems for the infant. Pregnant women should complete their pregnancy before getting the vaccine. If a woman finds out she is pregnant after she has started getting the vaccine series, she should complete her pregnancy before finishing the 3 doses. SHOULD BREASTFEEDING MOTHERS BE VACCINATED? Mothers nursing their babies may get the vaccine because the virus is inactivated and will not harm the mother or baby. WILL INDIVIDUALS BE PROTECTED AGAINST HPV AND RELATED DISEASES, EVEN IF THEY DO NOT GET ALL 3 DOSES? It is not yet known how much protection individuals will get from receiving only 1 or 2 doses of the vaccine. For this reason, it is very important that individuals get all 3 doses of the vaccine. WILL  CHILDREN BE REQUIRED TO BE VACCINATED TO ENTER SCHOOL? There are no federal laws that require children or adolescents to get vaccinated. All school entry laws are state laws so they vary from state to state. To find out what vaccines are needed for children or adolescents to enter school in your state, check with your state health department or board of education. ARE THERE OTHER WAYS TO PREVENT HPV? The only sure way to prevent HPV is to abstain from all sexual activity. Sexually active adults can reduce their risk by being in a mutually monogamous relationship with someone who has had no other sex partners.  But even individuals with only 1 lifetime sex partner can get HPV, if their partner has had a previous partner with HPV. It is unknown how much protection condoms provide against HPV, since areas that are not covered by a condom can be exposed to the virus. However, condoms may reduce the risk of genital warts and cervical cancer. They can also reduce the risk of HIV and some other sexually transmitted infections (STIs), when used consistently and correctly (all the time and the right way). Document Released: 11/18/2005 Document Revised: 02/10/2012 Document Reviewed: 07/14/2009 [employer] Patient Information 2014 [place], [place]. Tetanus, Diphtheria, Pertussis (Tdap) Vaccine What You Need to Know WHY GET VACCINATED? Tetanus, diphtheria and pertussis can be very serious diseases, even for adolescents and adults. Tdap vaccine can protect [place] from these diseases. TETANUS (Lockjaw) causes painful muscle tightening and stiffness, usually all over the body.  It can lead to tightening of muscles in the head and neck so you can't open your mouth, swallow, or sometimes even breathe. Tetanus kills about 1 out of 5 people who are infected. DIPHTHERIA can cause a thick coating to form in the back of the throat.  It can lead to breathing problems, paralysis, heart failure, and death. PERTUSSIS (Whooping Cough)  causes severe coughing spells, which can cause difficulty breathing, vomiting and disturbed sleep.  It can also lead to weight loss, incontinence, and rib fractures. Up to 2 in 100 adolescents and 5 in 100 adults with pertussis are hospitalized or have complications, which could include pneumonia and death. These diseases are caused by bacteria. Diphtheria and pertussis are spread from person to person through coughing or sneezing. Tetanus enters the body through cuts, scratches, or wounds. Before vaccines, the [place] saw as many as 200,000 cases a year of diphtheria and pertussis, and hundreds of cases of tetanus. Since vaccination began, tetanus and diphtheria have dropped by about 99% and pertussis by about 80%. TDAP VACCINE Tdap vaccine can protect adolescents and adults from tetanus, diphtheria, and pertussis. One dose of Tdap is routinely given at age 27 or 64. People who did not get Tdap at that age should get it as soon as possible. Tdap is especially important for health care professionals and anyone having close contact with a baby younger than 12 months. Pregnant women should get a dose of Tdap during every pregnancy, to protect the newborn from pertussis. Infants are most at risk for severe, life-threatening complications from pertussis. A similar vaccine, called Td, protects from tetanus and diphtheria, but not pertussis. A Td booster should be given every 10 years. Tdap may be given as one of these boosters if you have not already gotten a dose. Tdap may also be given after a severe cut or burn to prevent tetanus infection. Your doctor can give you more information. Tdap may safely be given at the same time as other vaccines. SOME PEOPLE SHOULD NOT GET THIS VACCINE  If you ever had a life-threatening allergic reaction after a dose of any tetanus, diphtheria, or pertussis containing vaccine, OR if you have a severe allergy to any part of this vaccine, you should not get Tdap. Tell  your doctor if you have any severe allergies.  If you had a coma, or long or multiple seizures within 7 days after a childhood dose of DTP or DTaP, you should not get Tdap, unless a cause other than the vaccine was found. You can still get Td.  Talk to your doctor if you:  have epilepsy or  another nervous system problem,  had severe pain or swelling after any vaccine containing diphtheria, tetanus or pertussis,  ever had Guillain-Barr Syndrome (GBS),  aren't feeling well on the day the shot is scheduled. RISKS OF A VACCINE REACTION With any medicine, including vaccines, there is a chance of side effects. These are usually mild and go away on their own, but serious reactions are also possible. Brief fainting spells can follow a vaccination, leading to injuries from falling. Sitting or lying down for about 15 minutes can help prevent these. Tell your doctor if you feel dizzy or light-headed, or have vision changes or ringing in the ears. Mild problems following Tdap (Did not interfere with activities)  Pain where the shot was given (about 3 in 4 adolescents or 2 in 3 adults)  Redness or swelling where the shot was given (about 1 person in 5)  Mild fever of at least 100.26F (up to about 1 in 25 adolescents or 1 in 100 adults)  Headache (about 3 or 4 people in 10)  Tiredness (about 1 person in 3 or 4)  Nausea, vomiting, diarrhea, stomach ache (up to 1 in 4 adolescents or 1 in 10 adults)  Chills, body aches, sore joints, rash, swollen glands (uncommon) Moderate problems following Tdap (Interfered with activities, but did not require medical attention)  Pain where the shot was given (about 1 in 5 adolescents or 1 in 100 adults)  Redness or swelling where the shot was given (up to about 1 in 16 adolescents or 1 in 25 adults)  Fever over 102F (about 1 in 100 adolescents or 1 in 250 adults)  Headache (about 3 in 20 adolescents or 1 in 10 adults)  Nausea, vomiting, diarrhea, stomach  ache (up to 1 or 3 people in 100)  Swelling of the entire arm where the shot was given (up to about 3 in 100). Severe problems following Tdap (Unable to perform usual activities, required medical attention)  Swelling, severe pain, bleeding and redness in the arm where the shot was given (rare). A severe allergic reaction could occur after any vaccine (estimated less than 1 in a million doses). WHAT IF THERE IS A SERIOUS REACTION? What should I look for?  Look for anything that concerns you, such as signs of a severe allergic reaction, very high fever, or behavior changes. Signs of a severe allergic reaction can include hives, swelling of the face and throat, difficulty breathing, a fast heartbeat, dizziness, and weakness. These would start a few minutes to a few hours after the vaccination. What should I do?  If you think it is a severe allergic reaction or other emergency that can't wait, call 9-1-1 or get the person to the nearest hospital. Otherwise, call your doctor.  Afterward, the reaction should be reported to the "Vaccine Adverse Event Reporting System" (VAERS). Your doctor might file this report, or you can do it yourself through the VAERS web site at www.vaers.LAgents.no, or by calling 1-3368556603. VAERS is only for reporting reactions. They do not give medical advice.  THE NATIONAL VACCINE INJURY COMPENSATION PROGRAM The National Vaccine Injury Compensation Program (VICP) is a federal program that was created to compensate people who may have been injured by certain vaccines. Persons who believe they may have been injured by a vaccine can learn about the program and about filing a claim by calling 1-825-652-3668 or visiting the VICP website at SpiritualWord.at. HOW CAN I LEARN MORE?  Ask your doctor.  Call your local or state health  department.  Contact the Centers for Disease Control and Prevention (CDC):  Call 716-238-3481 or visit CDC's website at  PicCapture.uy. CDC Tdap Vaccine VIS (04/09/12) Document Released: 05/19/2012 Document Revised: 08/12/2012 Document Reviewed: 05/19/2012 [employer] Patient Information 2014 [place], [place].

## 2013-07-16 NOTE — Progress Notes (Signed)
Tiffany Mills Dec 04, 1985 [id number]   History:    27 y.o.  for annual gyn exam when no complaints today. 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 She is on Sprintec 28 day oral contraceptive pills and is doing well. [facility] is doing her lab work since they are treating her for type 2 diabetes, hypercholesterolemia, and hypertension. Patient reports regular menses.  Today's ultrasound Uterus measures 8.0 x 4.3 x 3.2 cm with normal endometrial stripe of 6.9 ohmmeter. Normal ovaries and uterus.  Patient's not sexually active  Past medical history,surgical history, family history and social history were all reviewed and documented in the EPIC chart.  Gynecologic History Patient's last menstrual period was 06/15/2013. Contraception: OCP (estrogen/progesterone) Last Pap: 2012. Results were: normal Last mammogram: none indicated. Results were: none indicated  Obstetric History OB History  Gravida Para Term Preterm AB SAB TAB Ectopic Multiple Living  0                  ROS: A ROS was performed and pertinent positives and negatives are included in the history.  GENERAL: No fevers or chills. HEENT: No change in vision, no earache, sore throat or sinus congestion. NECK: No pain or stiffness. CARDIOVASCULAR: No chest pain or pressure. No palpitations. PULMONARY: No shortness of breath, cough or wheeze. GASTROINTESTINAL: No abdominal pain, nausea, vomiting or diarrhea, melena or bright red blood per rectum. GENITOURINARY: No urinary frequency, urgency, hesitancy or dysuria. MUSCULOSKELETAL: No joint or muscle pain, no back pain, no recent trauma. DERMATOLOGIC: No rash, no itching, no lesions. ENDOCRINE: No  polyuria, polydipsia, no heat or cold intolerance. No recent change in weight. HEMATOLOGICAL: No anemia or easy bruising or bleeding. NEUROLOGIC: No headache, seizures, numbness, tingling or weakness. PSYCHIATRIC: No depression, no loss of interest in normal activity or change in sleep pattern.     Exam: chaperone present  BP 112/72  Ht 5\' 4"  (1.626 m)  Wt 241 lb (109.317 kg)  BMI 41.35 kg/m2  LMP 06/15/2013  Body mass index is 41.35 kg/(m^2).  General appearance : Well developed well nourished female. No acute distress HEENT: Neck supple, trachea midline, no carotid bruits, no thyroidmegaly Lungs: Clear to auscultation, no rhonchi or wheezes, or rib retractions  Heart: Regular rate and rhythm, no murmurs or gallops Breast:Examined in sitting and supine position were symmetrical in appearance, no palpable masses or tenderness,  no skin retraction, no nipple inversion, no nipple discharge, no skin discoloration, no axillary or supraclavicular lymphadenopathy Abdomen: no palpable masses or tenderness, no rebound or guarding Extremities: no edema or skin discoloration or tenderness  Pelvic:  Bartholin, Urethra, Skene Glands: Within normal limits             Vagina: No gross lesions or discharge  Cervix: No gross lesions or discharge  Uterus  anteverted, normal size, shape and consistency, non-tender and mobile  Adnexa  Without masses or tenderness  Anus and perineum  normal   Rectovaginal  normal sphincter tone without palpated masses or tenderness             Hemoccult none indicated     Assessment/Plan:  27 y.o. female for annual exam with past history of right ovarian SEROUS CYSTADENOFIBROMA in 2004 with no evidence of  recurrence. Patient had a normal ultrasound today. She will stop by the lab to check a CA 125. We did discuss the new Pap smear screening guidelines and she would not need a Pap smear this year. Patient not sexually active. She was encouraged to do her monthly self  breast examination. Literature information on cholesterol-lowering diet was provided along with an exercise protocol.     Ok Edwards MD, 10:27 AM 07/16/2013

## 2013-07-17 LAB — CA 125: CA 125: 16.9 U/mL (ref 0.0–30.2)

## 2016-02-13 ENCOUNTER — Encounter: Payer: Self-pay | Admitting: Dietician

## 2016-02-13 ENCOUNTER — Encounter: Payer: Commercial Managed Care - PPO | Attending: Family Medicine | Admitting: Dietician

## 2016-02-13 VITALS — Ht 64.0 in | Wt 235.0 lb

## 2016-02-13 DIAGNOSIS — E118 Type 2 diabetes mellitus with unspecified complications: Secondary | ICD-10-CM | POA: Diagnosis not present

## 2016-02-13 DIAGNOSIS — E282 Polycystic ovarian syndrome: Secondary | ICD-10-CM

## 2016-02-13 DIAGNOSIS — K760 Fatty (change of) liver, not elsewhere classified: Secondary | ICD-10-CM | POA: Insufficient documentation

## 2016-02-13 DIAGNOSIS — IMO0002 Reserved for concepts with insufficient information to code with codable children: Secondary | ICD-10-CM

## 2016-02-13 DIAGNOSIS — E7800 Pure hypercholesterolemia, unspecified: Secondary | ICD-10-CM | POA: Diagnosis present

## 2016-02-13 DIAGNOSIS — E785 Hyperlipidemia, unspecified: Secondary | ICD-10-CM

## 2016-02-13 DIAGNOSIS — E1165 Type 2 diabetes mellitus with hyperglycemia: Secondary | ICD-10-CM

## 2016-02-13 NOTE — Progress Notes (Signed)
Diabetes Self-Management Education  Visit Type: First/Initial  Appt. Start Time: 1400 Appt. End Time: 1500  02/13/2016  [name], identified by name and date of birth, is a 30 y.o. female with a diagnosis of Diabetes: Type 2. Other hx includes hyperlipidemia, non alcoholic fatty liver disease, PCOS and recent kidney stone.  Cholesterol 288, triglycerides 587, HDL 39.  She just started cholesterol medication a couple of months ago.    Patient lives alone.  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  ASSESSMENT  Height 5\' 4"  (1.626 m), weight 235 lb (106.595 kg). Body mass index is 40.32 kg/(m^2).  Lowest adult weight:  200 lbs Highest adult weight 235 lbs      Diabetes Self-Management Education - 02/13/16 1359    Visit Information   Visit Type First/Initial   Initial Visit   Diabetes Type Type 2   Are you currently following a meal plan? No   Are you taking your medications as prescribed? Yes   Date Diagnosed 2014   Health Coping   How would you rate your overall health? Good   Psychosocial Assessment   Patient Belief/Attitude about Diabetes Motivated to manage diabetes   Self-care barriers None   Self-management support Doctor's office   Other persons present Patient   Patient Concerns Nutrition/Meal planning   Special Needs None   Preferred Learning Style No preference indicated   Learning Readiness Ready   How often do you need to have someone help you when you read instructions, pamphlets, or other written materials from your doctor or pharmacy? 1 - Never   What is the last grade level you completed in school? 2 years college   Complications   Last HgB A1C per patient/outside source 10.9 %  01/04/2016   How often do you check your blood sugar? 0 times/day (not testing)   Number of hypoglycemic episodes per month 0   Have you had a dilated eye exam in the past 12 months? No   Have you had a dental exam in the past 12 months? Yes   Are you checking  your feet? Yes   How many days per week are you checking your feet? 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 7   How many minutes per day do you exercise? 30   Total minutes per week of exercise 210   Patient Education   Previous Diabetes Education No   Disease state  Definition of diabetes, type 1 and 2, and the diagnosis of diabetes   Nutrition management  Role of diet in the treatment of diabetes and the relationship between the three main macronutrients and blood glucose level;Food label reading, portion sizes and measuring food.;Meal options for control of blood glucose level and chronic complications.   Physical activity and exercise  Role of exercise on diabetes management, blood pressure control and cardiac health.   Monitoring Purpose and frequency of SMBG.;Identified appropriate SMBG and/or A1C goals.;Daily foot exams;Yearly dilated  eye exam   Chronic complications Dental care;Retinopathy and reason for yearly dilated eye exams;Identified and discussed with patient  current chronic complications   Psychosocial adjustment Worked with patient to identify barriers to care and solutions;Role of stress on diabetes   Individualized Goals (developed by patient)   Nutrition General guidelines for healthy choices and portions discussed   Physical Activity Exercise 5-7 days per week;30 minutes per day   Medications take my medication as prescribed   Monitoring  test my blood glucose as discussed   Reducing Risk Other (comment)  increase  non starchy vegetable intake and decrease carbohydrate intake   Outcomes   Expected Outcomes Demonstrated interest in learning. Expect positive outcomes   Future DMSE PRN   Program Status Completed      Individualized Plan for Diabetes Self-Management Training:   Learning Objective:  Patient will have a greater understanding of diabetes self-management. Patient education plan is to attend individual and/or group sessions per assessed needs and concerns.  Nutrition also related to PCOS, weight loss and hyperlipidemia also incorporated.   Plan:   Patient Instructions  Keep up the exercise.  Aim for at least 30 minutes most days and increase to 1 hour as able. Aim for 2-3 servings (30-45 grams) of carbohydrates per meal A small portion of protein with each meal and snack. Increase your intake of non starchy vegetables to 1/2 the plate Consider more meatless meals.  Use beans, low fat yogurt or milk, or meat substitute (vegeburger) for protein. Take your medication as prescribed. Take your blood sugar once per day (set a timer if you need).  Rotate.  Consider increasing your Omega 3 intake (salmon, tuna, chia seeds, walnuts, flax seeds) 1500-4 grams per day Lower dairy (2 or less servings per day) Aerobic and resistance training Consider Inositol 1.2-4 grams/day  Expected Outcomes:  Demonstrated interest in learning. Expect positive outcomes  Education material provided: Living Well with Diabetes, Food label handouts, A1C conversion sheet, Meal plan card, My Plate and Snack sheet, Heart Healthy cooking and shopping tips from AND, Nutrition therapy to prevent kidney stones from AND  If problems or questions, patient to contact team via:  Phone and Email  Future DSME appointment: PRN

## 2016-02-13 NOTE — Patient Instructions (Signed)
Keep up the exercise.  Aim for at least 30 minutes most days and increase to 1 hour as able. Aim for 2-3 servings (30-45 grams) of carbohydrates per meal A small portion of protein with each meal and snack. Increase your intake of non starchy vegetables to 1/2 the plate Consider more meatless meals.  Use beans, low fat yogurt or milk, or meat substitute (vegeburger) for protein. Take your medication as prescribed. Take your blood sugar once per day (set a timer if you need).  Rotate.  Consider increasing your Omega 3 intake (salmon, tuna, chia seeds, walnuts, flax seeds) 1500-4 grams per day Lower dairy (2 or less servings per day) Aerobic and resistance training Consider Inositol 1.2-4 grams/day

## 2020-08-17 ENCOUNTER — Other Ambulatory Visit: Payer: Self-pay

## 2020-08-17 ENCOUNTER — Ambulatory Visit (INDEPENDENT_AMBULATORY_CARE_PROVIDER_SITE_OTHER): Payer: 59 | Admitting: Ophthalmology

## 2020-08-17 ENCOUNTER — Encounter (INDEPENDENT_AMBULATORY_CARE_PROVIDER_SITE_OTHER): Payer: Self-pay | Admitting: Ophthalmology

## 2020-08-17 DIAGNOSIS — E113512 Type 2 diabetes mellitus with proliferative diabetic retinopathy with macular edema, left eye: Secondary | ICD-10-CM | POA: Diagnosis not present

## 2020-08-17 DIAGNOSIS — E113491 Type 2 diabetes mellitus with severe nonproliferative diabetic retinopathy without macular edema, right eye: Secondary | ICD-10-CM

## 2020-08-17 DIAGNOSIS — E113411 Type 2 diabetes mellitus with severe nonproliferative diabetic retinopathy with macular edema, right eye: Secondary | ICD-10-CM | POA: Insufficient documentation

## 2020-08-17 MED ORDER — FLUORESCEIN SODIUM 10 % IV SOLN
500.0000 mg | INTRAVENOUS | Status: AC | PRN
  Administered 2020-08-17: 500 mg via INTRAVENOUS

## 2020-08-17 NOTE — Assessment & Plan Note (Signed)
The nature of severe nonproliferative diabetic retinopathy discussed with the patient as well as the need for more frequent follow up and likely progression to proliferative disease in the near future. The options of continued observation versus panretinal photocoagulation at this time were reviewed as well as the risks, benefits, and alternatives. More recent option includes the use of ocular injectable medications to slow progression of retinal disease. Tight control of glucose, blood pressure, and serum lipid levels were recommended under the direction of general physician or endocrinologist, as well as avoidance of smoking and maintenance of normal body weight. The 2-year risk of progression to proliferative diabetic retinopathy is 60%.  Findings OD on clinical examination, pending fluorescein angiography

## 2020-08-17 NOTE — Progress Notes (Signed)
08/17/2020     CHIEF COMPLAINT Patient presents for Retina Evaluation   HISTORY OF PRESENT ILLNESS: Tiffany Mills is a 34 y.o. female who presents to the clinic today for:   HPI    Retina Evaluation    In both eyes.  Context:  distance vision, mid-range vision and near vision.  Treatments tried include no treatments.          Comments    NP Diabetic Eval OU  Pt reports intermittent blind spot OS in the mornings upon waking. Pt sts she saw intermittent floaters OS>OD, but sts since she started diet and exercise, they have improved. Stable VA OD. A1c: 11.0, 06/2020 LBS: does not check       Last edited by Rockie Neighbours, [place] on 08/17/2020  8:50 AM. (History)      Referring physician: Vicenta Aly, [place],  [place] [id number]  HISTORICAL INFORMATION:   Selected notes from the MEDICAL RECORD NUMBER       CURRENT MEDICATIONS: No current outpatient medications on file. (Ophthalmic Drugs)   No current facility-administered medications for this visit. (Ophthalmic Drugs)   Current Outpatient Medications (Other)  Medication Sig  . benazepril (LOTENSIN) 5 MG tablet Take 5 mg by mouth daily.  Marland Kitchen buPROPion (WELLBUTRIN XL) 300 MG 24 hr tablet Take 300 mg by mouth daily. Reported on 02/13/2016  . fenofibrate 160 MG tablet Take 160 mg by mouth daily. Reported on 02/13/2016  . gemfibrozil (LOPID) 600 MG tablet Take 600 mg by mouth 2 (two) times daily before a meal.  . metFORMIN (GLUCOPHAGE) 1000 MG tablet Take 1,000 mg by mouth 2 (two) times daily with a meal.  . norgestimate-ethinyl estradiol (SPRINTEC 28) 0.25-35 MG-MCG tablet Take 1 tablet by mouth daily. (Patient not taking: Reported on 02/13/2016)  . sitaGLIPtin (JANUVIA) 100 MG tablet Take 100 mg by mouth daily.   No current facility-administered medications for this visit. (Other)      REVIEW OF SYSTEMS:    ALLERGIES No Known Allergies  PAST MEDICAL HISTORY Past Medical  History:  Diagnosis Date  . Depression   . Diabetes mellitus    TYPE II  . Hypercholesterolemia   . Serous cystadenofibroma    OF RIGHT OVARY,, WITH SCATTERED FOCI, SEROUS BODERLINE TUMOR.   Past Surgical History:  Procedure Laterality Date  . PELVIC LAPAROSCOPY     RIGHT OVARIAN CYSTECTOMY, PARTIAL OOPHORECTOMY    FAMILY HISTORY Family History  Problem Relation Age of Onset  . Diabetes Maternal Grandmother   . Hypertension Maternal Grandfather   . Cancer Maternal Grandfather        COLON, SKIN    SOCIAL HISTORY Social History   Tobacco Use  . Smoking status: Never Smoker  . Smokeless tobacco: Never Used  Substance Use Topics  . Alcohol use: Yes    Comment: social only  . Drug use: No         OPHTHALMIC EXAM:  Base Eye Exam    Visual Acuity (ETDRS)      Right Left   Dist [place] 20/40 20/70   Dist ph [place] 20/20 -1 20/30 -2       Tonometry (Tonopen, 8:52 AM)      Right Left   Pressure 18 17       Pupils      Pupils Dark Light Shape React APD   Right PERRL 5 4 Round Brisk None   Left PERRL 5 4 Round Brisk None  Visual Fields (Counting fingers)      Left Right    Full Full       Extraocular Movement      Right Left    Full Full       Neuro/Psych    Oriented x3: Yes   Mood/Affect: Normal       Dilation    Both eyes: 1.0% Mydriacyl, 2.5% Phenylephrine @ 8:55 AM        Slit Lamp and Fundus Exam    External Exam      Right Left   External Normal Normal       Slit Lamp Exam      Right Left   Lids/Lashes Normal Normal   Conjunctiva/Sclera White and quiet White and quiet   Cornea Clear Clear   Anterior Chamber Deep and quiet Deep and quiet   Iris Round and reactive Round and reactive   Lens Clear Clear   Anterior Vitreous Normal Normal       Fundus Exam      Right Left   Posterior Vitreous Normal Normal   Disc Normal Neovascularization >10-A   C/D Ratio 0.25 0.25   Macula Macular thickening, Microaneurysms, Mild clinically  significant macular edema Macular thickening, Microaneurysms, severe clinically significant macular edema   Vessels NPDR-Severe PDR-active   Periphery Normal Normal          IMAGING AND PROCEDURES  Imaging and Procedures for 08/17/20  OCT, Retina - OU - Both Eyes       Right Eye Quality was good. Scan locations included subfoveal. Central Foveal Thickness: 272. Findings include cystoid macular edema.   Left Eye Quality was good. Scan locations included subfoveal. Central Foveal Thickness: 393. Progression has no prior data. Findings include cystoid macular edema.   Notes OD with minor perifoveal CME from CSME.  OS with center involved CSME       Color Fundus Photography Optos - OU - Both Eyes       Right Eye Progression has no prior data. Disc findings include normal observations. Macula : exudates, microaneurysms. Vessels : IRMA.   Left Eye Progression has no prior data. Disc findings include neovascularization. Macula : exudates, microaneurysms, edema. Vessels : Neovascularization.        Fluorescein Angiography Optos (Transit OS)       Injection:  500 mg Fluorescein Sodium 10 % injection   NDC: [phone]   Route: Intravenous, Site: Right ArmRight Eye   Progression has no prior data. Mid/Late phase findings include vascular perfusion defect. Choroidal neovascularization is not present.   Left Eye   Progression has no prior data. Early phase findings include neovascularization disc, microaneurysm. Mid/Late phase findings include neovascularization disc, microaneurysm, vascular perfusion defect. Choroidal neovascularization is not present.   Notes OD with severe NPDR yet with extensive peripheral retinal nonperfusion and capillary dropout.  Late onset diffuse angiographic macular edema noted  OS, with active proliferative diabetic retinopathy, extensive retinal capillary dropout in the anterior portions of the retina with clinically significant macular  edema                ASSESSMENT/PLAN:  Proliferative diabetic retinopathy of left eye with macular edema associated with type 2 diabetes mellitus ([place]) The nature of proliferative diabetic retinopathy was discussed with the patient as well as the potential for a vitreous hemorrhage and traction on the retina. Tight control of glucose, blood pressure, and serum lipids was recommended in order to slow further progression of disease. Cessation of smoking  was recommended. Treatment options including ocular injectable medications, panretinal photocoagulation and/or vitrectomy surgery for advanced disease were discussed. Local medical therapy may slow or arrest progression of disease.   The nature of diabetic retinopathy was explained using the following analogy: "Retinopathy develops in the body's blood supply like salty water corrodes the linings of pipes in a house, until rust appears, then holes in the pipes develop which leak followed by destruction and loss of the pipes as the corrosion turns them to dust. In a similar fashion, Diabetes damages the blood supply of the body by cumulative long--term elevated blood sugar, which corrodes the blood supply in the body, particularly the blood vessels supplying the retina, kidneys, and nerves".  Thus, control of blood sugar, slows the progression of the corrosive effect of diabetes mellitus.     Severe nonproliferative diabetic retinopathy of right eye ([place]) The nature of severe nonproliferative diabetic retinopathy discussed with the patient as well as the need for more frequent follow up and likely progression to proliferative disease in the near future. The options of continued observation versus panretinal photocoagulation at this time were reviewed as well as the risks, benefits, and alternatives. More recent option includes the use of ocular injectable medications to slow progression of retinal disease. Tight control of glucose, blood pressure,  and serum lipid levels were recommended under the direction of general physician or endocrinologist, as well as avoidance of smoking and maintenance of normal body weight. The 2-year risk of progression to proliferative diabetic retinopathy is 60%.  Findings OD on clinical examination, pending fluorescein angiography      ICD-10-CM   1. Proliferative diabetic retinopathy of left eye with macular edema associated with type 2 diabetes mellitus (HCC)  C12.7517 OCT, Retina - OU - Both Eyes    Color Fundus Photography Optos - OU - Both Eyes    Fluorescein Angiography Optos (Transit OS)    Fluorescein Sodium 10 % injection 500 mg  2. Severe nonproliferative diabetic retinopathy of right eye without macular edema associated with type 2 diabetes mellitus (HCC)  E11.3491 OCT, Retina - OU - Both Eyes    Color Fundus Photography Optos - OU - Both Eyes    Fluorescein Angiography Optos (Transit OS)    Fluorescein Sodium 10 % injection 500 mg    1.  OD, the plan will be use of intravitreal Avastin in the coming weeks to control angiographic and minor CSME, and prevent progression to PDR  2.  OS, the plan will be to use intravitreal Avastin in the coming days as the patient is driving herself today and both eyes were dilated.  I explained to the patient that the injection is an attempt to halt the blood vessels growing because the swelling in the macular region to diminish ultimately we will be able to deliver laser treatment to the outer edges of the retina to prevent progression of diabetic retinopathy and to diminish treatment burden  3.  Ophthalmic Meds Ordered this visit:  Meds ordered this encounter  Medications  . Fluorescein Sodium 10 % injection 500 mg       Return in about 4 days (around 08/21/2020) for AVASTIN OCT, OS, NO DILATE.  Patient Instructions  Patient asked to report new onset dark spots, floaters.  Patient reassured that this could occur at any time with progression of  diabetic retinopathy or even after therapy as neovascular tissue regresses and withers away    Explained the diagnoses, plan, and follow up with the patient and  they expressed understanding.  Patient expressed understanding of the importance of proper follow up care.   Clent Demark Boyd Litaker M.D. Diseases & Surgery of the Retina and Vitreous Retina & Diabetic [place] 08/17/20     Abbreviations: M myopia (nearsighted); A astigmatism; H hyperopia (farsighted); P presbyopia; Mrx spectacle prescription;  CTL contact lenses; OD right eye; OS left eye; OU both eyes  XT exotropia; ET esotropia; PEK punctate epithelial keratitis; PEE punctate epithelial erosions; DES dry eye syndrome; MGD meibomian gland dysfunction; ATs artificial tears; PFAT's preservative free artificial tears; [place] nuclear sclerotic cataract; PSC posterior subcapsular cataract; ERM epi-retinal membrane; PVD posterior vitreous detachment; RD retinal detachment; DM diabetes mellitus; DR diabetic retinopathy; NPDR non-proliferative diabetic retinopathy; PDR proliferative diabetic retinopathy; CSME clinically significant macular edema; DME diabetic macular edema; dbh dot blot hemorrhages; CWS cotton wool spot; POAG primary open angle glaucoma; C/D cup-to-disc ratio; HVF humphrey visual field; GVF goldmann visual field; OCT optical coherence tomography; IOP intraocular pressure; BRVO Branch retinal vein occlusion; CRVO central retinal vein occlusion; CRAO central retinal artery occlusion; BRAO branch retinal artery occlusion; RT retinal tear; SB scleral buckle; PPV pars plana vitrectomy; VH Vitreous hemorrhage; PRP panretinal laser photocoagulation; IVK intravitreal kenalog; VMT vitreomacular traction; MH Macular hole;  NVD neovascularization of the disc; NVE neovascularization elsewhere; AREDS age related eye disease study; ARMD age related macular degeneration; POAG primary open angle glaucoma; EBMD epithelial/anterior basement membrane dystrophy;  ACIOL anterior chamber intraocular lens; IOL intraocular lens; PCIOL posterior chamber intraocular lens; Phaco/IOL phacoemulsification with intraocular lens placement; [place] photorefractive keratectomy; LASIK laser assisted in situ keratomileusis; HTN hypertension; DM diabetes mellitus; COPD chronic obstructive pulmonary disease

## 2020-08-17 NOTE — Patient Instructions (Signed)
Patient asked to report new onset dark spots, floaters.  Patient reassured that this could occur at any time with progression of diabetic retinopathy or even after therapy as neovascular tissue regresses and withers away

## 2020-08-17 NOTE — Assessment & Plan Note (Signed)
The nature of proliferative diabetic retinopathy was discussed with the patient as well as the potential for a vitreous hemorrhage and traction on the retina. Tight control of glucose, blood pressure, and serum lipids was recommended in order to slow further progression of disease. Cessation of smoking was recommended. Treatment options including ocular injectable medications, panretinal photocoagulation and/or vitrectomy surgery for advanced disease were discussed. Local medical therapy may slow or arrest progression of disease.   The nature of diabetic retinopathy was explained using the following analogy: "Retinopathy develops in the body's blood supply like salty water corrodes the linings of pipes in a house, until rust appears, then holes in the pipes develop which leak followed by destruction and loss of the pipes as the corrosion turns them to dust. In a similar fashion, Diabetes damages the blood supply of the body by cumulative long--term elevated blood sugar, which corrodes the blood supply in the body, particularly the blood vessels supplying the retina, kidneys, and nerves".  Thus, control of blood sugar, slows the progression of the corrosive effect of diabetes mellitus.

## 2020-08-21 ENCOUNTER — Ambulatory Visit (INDEPENDENT_AMBULATORY_CARE_PROVIDER_SITE_OTHER): Payer: 59 | Admitting: Ophthalmology

## 2020-08-21 ENCOUNTER — Encounter (INDEPENDENT_AMBULATORY_CARE_PROVIDER_SITE_OTHER): Payer: Self-pay | Admitting: Ophthalmology

## 2020-08-21 ENCOUNTER — Other Ambulatory Visit: Payer: Self-pay

## 2020-08-21 DIAGNOSIS — E113512 Type 2 diabetes mellitus with proliferative diabetic retinopathy with macular edema, left eye: Secondary | ICD-10-CM

## 2020-08-21 MED ORDER — BEVACIZUMAB CHEMO INJECTION 1.25MG/0.05ML SYRINGE FOR KALEIDOSCOPE
1.2500 mg | INTRAVITREAL | Status: AC | PRN
  Administered 2020-08-21: 1.25 mg via INTRAVITREAL

## 2020-08-21 NOTE — Assessment & Plan Note (Addendum)
OS, with severe CSME, underlying clinical neovascularization of the optic nerve as well.  Commence with intravitreal Avastin OS today  I explained the response to therapy using the Mac truck analogy as well as the brakes on the process.  I reviewed the salt water in the type story as well to explain the nature good blood sugar control.  I reassured the patient who is here with her mom today that should vitreous hemorrhage develop that hemorrhage alone will not make and I go blind.  Was to control the blood vessel growth, that is slow and cause a regression of neovascularization

## 2020-08-21 NOTE — Progress Notes (Signed)
08/21/2020     CHIEF COMPLAINT Patient presents for Retina Follow Up   HISTORY OF PRESENT ILLNESS: Tiffany Mills is a 34 y.o. female who presents to the clinic today for:   HPI    Retina Follow Up    Patient presents with  Diabetic Retinopathy.  In left eye.  Severity is moderate.  Duration of 4 days.  Since onset it is stable.  I, the attending physician,  performed the HPI with the patient and updated documentation appropriately.          Comments    Avastin OS. OCT  Pt states no changes or issues since last exam.       Last edited by [name] on 08/21/2020  3:38 PM. (History)      Referring physician: Vicenta Aly, [place],  [place] [address]  HISTORICAL INFORMATION:   Selected notes from the [facility]: No current outpatient medications on file. (Ophthalmic Drugs)   No current facility-administered medications for this visit. (Ophthalmic Drugs)   Current Outpatient Medications (Other)  Medication Sig  . benazepril (LOTENSIN) 5 MG tablet Take 5 mg by mouth daily.  Marland Kitchen buPROPion (WELLBUTRIN XL) 300 MG 24 hr tablet Take 300 mg by mouth daily. Reported on 02/13/2016  . fenofibrate 160 MG tablet Take 160 mg by mouth daily. Reported on 02/13/2016  . gemfibrozil (LOPID) 600 MG tablet Take 600 mg by mouth 2 (two) times daily before a meal.  . metFORMIN (GLUCOPHAGE) 1000 MG tablet Take 1,000 mg by mouth 2 (two) times daily with a meal.  . norgestimate-ethinyl estradiol (SPRINTEC 28) 0.25-35 MG-MCG tablet Take 1 tablet by mouth daily. (Patient not taking: Reported on 02/13/2016)  . sitaGLIPtin (JANUVIA) 100 MG tablet Take 100 mg by mouth daily.   No current facility-administered medications for this visit. (Other)      REVIEW OF SYSTEMS:    ALLERGIES No Known Allergies  PAST MEDICAL HISTORY Past Medical History:  Diagnosis Date  . Depression   . Diabetes mellitus    TYPE II  .  Hypercholesterolemia   . Serous cystadenofibroma    OF RIGHT OVARY,, WITH SCATTERED FOCI, SEROUS BODERLINE TUMOR.   Past Surgical History:  Procedure Laterality Date  . PELVIC LAPAROSCOPY     RIGHT OVARIAN CYSTECTOMY, PARTIAL OOPHORECTOMY    FAMILY HISTORY Family History  Problem Relation Age of Onset  . Diabetes Maternal Grandmother   . Hypertension Maternal Grandfather   . Cancer Maternal Grandfather        COLON, SKIN    SOCIAL HISTORY Social History   Tobacco Use  . Smoking status: Never Smoker  . Smokeless tobacco: Never Used  Substance Use Topics  . Alcohol use: Yes    Comment: social only  . Drug use: No         OPHTHALMIC EXAM: Base Eye Exam    Visual Acuity (Snellen - Linear)      Right Left   Dist [place] 20/50 20/50 -2   Dist ph [place] 20/20 -2 20/30 -2       Tonometry (Tonopen, 3:41 PM)      Right Left   Pressure 18 17       Neuro/Psych    Oriented x3: Yes   Mood/Affect: Normal        Slit Lamp and Fundus Exam    External Exam      Right Left  External Normal Normal       Slit Lamp Exam      Right Left   Lids/Lashes Normal Normal   Conjunctiva/Sclera White and quiet White and quiet   Cornea Clear Clear   Anterior Chamber Deep and quiet Deep and quiet   Iris Round and reactive Round and reactive   Lens Clear Clear   Anterior Vitreous Normal Normal          IMAGING AND PROCEDURES  Imaging and Procedures for 08/21/20  OCT, Retina - OU - Both Eyes       Right Eye Quality was good. Scan locations included subfoveal. Central Foveal Thickness: 272. Progression has no prior data. Findings include abnormal foveal contour.   Left Eye Quality was good. Scan locations included subfoveal. Central Foveal Thickness: 393. Progression has no prior data. Findings include abnormal foveal contour.   Notes Macular thickening, OD will need intravitreal Avastin soon also control severe NPDR  OS, with severe CSME, underlying clinical  neovascularization of the optic nerve as well.  Commence with intravitreal Avastin OS today       Intravitreal Injection, Pharmacologic Agent - OS - Left Eye       Time Out 08/21/2020. 4:13 PM. Confirmed correct patient, procedure, site, and patient consented.   Anesthesia Topical anesthesia was used. Anesthetic medications included Akten 3.5%.   Procedure Preparation included Tobramycin 0.3%, 10% betadine to eyelids, 5% betadine to ocular surface. A supplied needle was used.   Injection:  1.25 mg Bevacizumab (AVASTIN) SOLN   NDC: [address]-8, Lot: [id number]   Route: Intravitreal, Site: Left Eye, Waste: 0 mg  Post-op Post injection exam found visual acuity of at least counting fingers. The patient tolerated the procedure well. There were no complications. The patient received written and verbal post procedure care education. Post injection medications were not given.                 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  Commence with intravitreal Avastin OS today  I explained the response to therapy using the Mac truck analogy as well as the brakes on the process.  I reviewed the salt water in the type story as well to explain the nature good blood sugar control.  I reassured the patient who is here with her mom today that should vitreous hemorrhage develop that hemorrhage alone will not make and I go blind.  Was to control the blood vessel growth, that is slow and cause a regression of neovascularization      ICD-10-CM   1. Proliferative diabetic retinopathy of left eye with macular edema associated with type 2 diabetes mellitus (HCC)  T61.4431 OCT, Retina - OU - Both Eyes    Intravitreal Injection, Pharmacologic Agent - OS - Left Eye    Bevacizumab (AVASTIN) SOLN 1.25 mg     1.  2.  3.  Ophthalmic Meds Ordered this visit:  Meds ordered this encounter  Medications  . Bevacizumab (AVASTIN) SOLN 1.25 mg       Return in about 1 week (around 08/28/2020) for OD, NO DILATE, AVASTIN OCT.  There are no Patient Instructions on file for this visit.   Explained the diagnoses, plan, and follow up with the patient and they expressed understanding.  Patient expressed understanding of the importance of proper follow up care.   Clent Demark Abron Neddo  M.D. Diseases & Surgery of the Retina and [place] 08/21/20     Abbreviations: M myopia (nearsighted); A astigmatism; H hyperopia (farsighted); P presbyopia; Mrx spectacle prescription;  CTL contact lenses; OD right eye; OS left eye; OU both eyes  XT exotropia; ET esotropia; PEK punctate epithelial keratitis; PEE punctate epithelial erosions; DES dry eye syndrome; MGD meibomian gland dysfunction; ATs artificial tears; PFAT's preservative free artificial tears; [place] nuclear sclerotic cataract; PSC posterior subcapsular cataract; ERM epi-retinal membrane; PVD posterior vitreous detachment; RD retinal detachment; DM diabetes mellitus; DR diabetic retinopathy; NPDR non-proliferative diabetic retinopathy; PDR proliferative diabetic retinopathy; CSME clinically significant macular edema; DME diabetic macular edema; dbh dot blot hemorrhages; CWS cotton wool spot; POAG primary open angle glaucoma; C/D cup-to-disc ratio; HVF humphrey visual field; GVF goldmann visual field; OCT optical coherence tomography; IOP intraocular pressure; BRVO Branch retinal vein occlusion; CRVO central retinal vein occlusion; CRAO central retinal artery occlusion; BRAO branch retinal artery occlusion; RT retinal tear; SB scleral buckle; PPV pars plana vitrectomy; VH Vitreous hemorrhage; PRP panretinal laser photocoagulation; IVK intravitreal kenalog; VMT vitreomacular traction; MH Macular hole;  NVD neovascularization of the disc;  NVE neovascularization elsewhere; AREDS age related eye disease study; ARMD age related macular degeneration; POAG primary open angle glaucoma; EBMD epithelial/anterior basement membrane dystrophy; ACIOL anterior chamber intraocular lens; IOL intraocular lens; PCIOL posterior chamber intraocular lens; Phaco/IOL phacoemulsification with intraocular lens placement; [place] photorefractive keratectomy; LASIK laser assisted in situ keratomileusis; HTN hypertension; DM diabetes mellitus; COPD chronic obstructive pulmonary disease

## 2020-08-29 ENCOUNTER — Other Ambulatory Visit: Payer: Self-pay

## 2020-08-29 ENCOUNTER — Ambulatory Visit (INDEPENDENT_AMBULATORY_CARE_PROVIDER_SITE_OTHER): Payer: 59 | Admitting: Ophthalmology

## 2020-08-29 ENCOUNTER — Encounter (INDEPENDENT_AMBULATORY_CARE_PROVIDER_SITE_OTHER): Payer: Self-pay | Admitting: Ophthalmology

## 2020-08-29 DIAGNOSIS — E113491 Type 2 diabetes mellitus with severe nonproliferative diabetic retinopathy without macular edema, right eye: Secondary | ICD-10-CM | POA: Diagnosis not present

## 2020-08-29 MED ORDER — BEVACIZUMAB CHEMO INJECTION 1.25MG/0.05ML SYRINGE FOR KALEIDOSCOPE
1.2500 mg | INTRAVITREAL | Status: AC | PRN
  Administered 2020-08-29: 1.25 mg via INTRAVITREAL

## 2020-08-29 NOTE — Progress Notes (Signed)
08/29/2020     CHIEF COMPLAINT Patient presents for Retina Follow Up   HISTORY OF PRESENT ILLNESS: Tiffany Mills is a 34 y.o. female who presents to the clinic today for:   HPI    Retina Follow Up    Patient presents with  Diabetic Retinopathy.  In right eye.  Severity is severe.  Duration of 1 month.  Since onset it is stable.  I, the attending physician,  performed the HPI with the patient and updated documentation appropriately.          Comments    Avastin OD. OCT  Pt states no changes in vision since last exam.       Last edited by [name] on 08/29/2020  3:06 PM. (History)      Referring physician: Vicenta Aly, [place],  [place] [id number]  HISTORICAL INFORMATION:   Selected notes from the [facility]: No current outpatient medications on file. (Ophthalmic Drugs)   No current facility-administered medications for this visit. (Ophthalmic Drugs)   Current Outpatient Medications (Other)  Medication Sig  . benazepril (LOTENSIN) 5 MG tablet Take 5 mg by mouth daily.  Marland Kitchen buPROPion (WELLBUTRIN XL) 300 MG 24 hr tablet Take 300 mg by mouth daily. Reported on 02/13/2016  . fenofibrate 160 MG tablet Take 160 mg by mouth daily. Reported on 02/13/2016  . gemfibrozil (LOPID) 600 MG tablet Take 600 mg by mouth 2 (two) times daily before a meal.  . metFORMIN (GLUCOPHAGE) 1000 MG tablet Take 1,000 mg by mouth 2 (two) times daily with a meal.  . norgestimate-ethinyl estradiol (SPRINTEC 28) 0.25-35 MG-MCG tablet Take 1 tablet by mouth daily. (Patient not taking: Reported on 02/13/2016)  . sitaGLIPtin (JANUVIA) 100 MG tablet Take 100 mg by mouth daily.   No current facility-administered medications for this visit. (Other)      REVIEW OF SYSTEMS:    ALLERGIES No Known Allergies  PAST MEDICAL HISTORY Past Medical History:  Diagnosis Date  . Depression   . Diabetes mellitus    TYPE II  .  Hypercholesterolemia   . Serous cystadenofibroma    OF RIGHT OVARY,, WITH SCATTERED FOCI, SEROUS BODERLINE TUMOR.   Past Surgical History:  Procedure Laterality Date  . PELVIC LAPAROSCOPY     RIGHT OVARIAN CYSTECTOMY, PARTIAL OOPHORECTOMY    FAMILY HISTORY Family History  Problem Relation Age of Onset  . Diabetes Maternal Grandmother   . Hypertension Maternal Grandfather   . Cancer Maternal Grandfather        COLON, SKIN    SOCIAL HISTORY Social History   Tobacco Use  . Smoking status: Never Smoker  . Smokeless tobacco: Never Used  Substance Use Topics  . Alcohol use: Yes    Comment: social only  . Drug use: No         OPHTHALMIC EXAM:  Base Eye Exam    Visual Acuity (Snellen - Linear)      Right Left   Dist [place] 20/50 20/50   Dist ph [place] 20/20 -2 20/30       Tonometry (Tonopen, 3:10 PM)      Right Left   Pressure 12 12       Pupils      Pupils Dark Light Shape React APD   Right PERRL 4 3 Round Slow None   Left PERRL 4 3 Round Slow None  Visual Fields (Counting fingers)      Left Right    Full Full       Neuro/Psych    Oriented x3: Yes   Mood/Affect: Normal        Slit Lamp and Fundus Exam    External Exam      Right Left   External Normal Normal       Slit Lamp Exam      Right Left   Lids/Lashes Normal Normal   Conjunctiva/Sclera White and quiet White and quiet   Cornea Clear Clear   Anterior Chamber Deep and quiet Deep and quiet   Iris Round and reactive Round and reactive   Lens Clear Clear   Anterior Vitreous Normal Normal          IMAGING AND PROCEDURES  Imaging and Procedures for 08/29/20  OCT, Retina - OU - Both Eyes       Right Eye Quality was good. Progression has been stable.   Left Eye Quality was good. Scan locations included subfoveal. Central Foveal Thickness: 309. Progression has improved. Findings include cystoid macular edema.   Notes CSME OU, improved OS after recent injection intravitreal  Avastin  Intravitreal Avastin OD today       Intravitreal Injection, Pharmacologic Agent - OD - Right Eye       Time Out 08/29/2020. 3:47 PM. Confirmed correct patient, procedure, site, and patient consented.   Anesthesia Topical anesthesia was used. Anesthetic medications included Akten 3.5%.   Procedure Preparation included Tobramycin 0.3%, 10% betadine to eyelids, 5% betadine to ocular surface. A supplied needle was used.   Injection:  1.25 mg Bevacizumab (AVASTIN) SOLN   NDC: [address]-7, Lot: [id number]   Route: Intravitreal, Site: Right Eye, Waste: 0 mg  Post-op Post injection exam found visual acuity of at least counting fingers. The patient tolerated the procedure well. There were no complications. The patient received written and verbal post procedure care education. Post injection medications were not given.                 ASSESSMENT/PLAN:  No problem-specific Assessment & Plan notes found for this encounter.      ICD-10-CM   1. Severe nonproliferative diabetic retinopathy of right eye without macular edema associated with type 2 diabetes mellitus (HCC)  E11.3491 OCT, Retina - OU - Both Eyes    Intravitreal Injection, Pharmacologic Agent - OD - Right Eye    Bevacizumab (AVASTIN) SOLN 1.25 mg    1.  2.  3.  Ophthalmic Meds Ordered this visit:  Meds ordered this encounter  Medications  . Bevacizumab (AVASTIN) SOLN 1.25 mg       Return in about 1 week (around 09/05/2020) for dilate, OS, PRP.  There are no Patient Instructions on file for this visit.   Explained the diagnoses, plan, and follow up with the patient and they expressed understanding.  Patient expressed understanding of the importance of proper follow up care.   Clent Demark Arnesha Schiraldi M.D. Diseases & Surgery of the Retina and Vitreous [facility] 08/29/20     Abbreviations: M myopia (nearsighted); A astigmatism; H hyperopia (farsighted); P presbyopia; Mrx spectacle  prescription;  CTL contact lenses; OD right eye; OS left eye; OU both eyes  XT exotropia; ET esotropia; PEK punctate epithelial keratitis; PEE punctate epithelial erosions; DES dry eye syndrome; MGD meibomian gland dysfunction; ATs artificial tears; PFAT's preservative free artificial tears; [place] nuclear sclerotic cataract; PSC posterior subcapsular cataract; ERM epi-retinal membrane; PVD  posterior vitreous detachment; RD retinal detachment; DM diabetes mellitus; DR diabetic retinopathy; NPDR non-proliferative diabetic retinopathy; PDR proliferative diabetic retinopathy; CSME clinically significant macular edema; DME diabetic macular edema; dbh dot blot hemorrhages; CWS cotton wool spot; POAG primary open angle glaucoma; C/D cup-to-disc ratio; HVF humphrey visual field; GVF goldmann visual field; OCT optical coherence tomography; IOP intraocular pressure; BRVO Branch retinal vein occlusion; CRVO central retinal vein occlusion; CRAO central retinal artery occlusion; BRAO branch retinal artery occlusion; RT retinal tear; SB scleral buckle; PPV pars plana vitrectomy; VH Vitreous hemorrhage; PRP panretinal laser photocoagulation; IVK intravitreal kenalog; VMT vitreomacular traction; MH Macular hole;  NVD neovascularization of the disc; NVE neovascularization elsewhere; AREDS age related eye disease study; ARMD age related macular degeneration; POAG primary open angle glaucoma; EBMD epithelial/anterior basement membrane dystrophy; ACIOL anterior chamber intraocular lens; IOL intraocular lens; PCIOL posterior chamber intraocular lens; Phaco/IOL phacoemulsification with intraocular lens placement; [place] photorefractive keratectomy; LASIK laser assisted in situ keratomileusis; HTN hypertension; DM diabetes mellitus; COPD chronic obstructive pulmonary disease

## 2020-09-06 ENCOUNTER — Other Ambulatory Visit: Payer: Self-pay

## 2020-09-06 ENCOUNTER — Encounter (INDEPENDENT_AMBULATORY_CARE_PROVIDER_SITE_OTHER): Payer: Self-pay | Admitting: Ophthalmology

## 2020-09-06 ENCOUNTER — Ambulatory Visit (INDEPENDENT_AMBULATORY_CARE_PROVIDER_SITE_OTHER): Payer: 59 | Admitting: Ophthalmology

## 2020-09-06 DIAGNOSIS — E113512 Type 2 diabetes mellitus with proliferative diabetic retinopathy with macular edema, left eye: Secondary | ICD-10-CM

## 2020-09-06 NOTE — Progress Notes (Signed)
09/06/2020     CHIEF COMPLAINT Patient presents for Retina Follow Up   HISTORY OF PRESENT ILLNESS: Tiffany Mills is a 34 y.o. female who presents to the clinic today for:   HPI    Retina Follow Up    Patient presents with  Diabetic Retinopathy.  Severity is severe.  Duration of 1 week.  Since onset it is stable.  I, the attending physician,  performed the HPI with the patient and updated documentation appropriately.          Comments    PRP OS.  Pt states vision is stable. BGL: did not check       Last edited by [name] on 09/06/2020  9:17 AM. (History)      Referring physician: Vicenta Aly, [place],  [place] [address]  HISTORICAL INFORMATION:   Selected notes from the MEDICAL RECORD NUMBER       CURRENT MEDICATIONS: No current outpatient medications on file. (Ophthalmic Drugs)   No current facility-administered medications for this visit. (Ophthalmic Drugs)   Current Outpatient Medications (Other)  Medication Sig   benazepril (LOTENSIN) 5 MG tablet Take 5 mg by mouth daily.   buPROPion (WELLBUTRIN XL) 300 MG 24 hr tablet Take 300 mg by mouth daily. Reported on 02/13/2016   fenofibrate 160 MG tablet Take 160 mg by mouth daily. Reported on 02/13/2016   gemfibrozil (LOPID) 600 MG tablet Take 600 mg by mouth 2 (two) times daily before a meal.   metFORMIN (GLUCOPHAGE) 1000 MG tablet Take 1,000 mg by mouth 2 (two) times daily with a meal.   norgestimate-ethinyl estradiol (SPRINTEC 28) 0.25-35 MG-MCG tablet Take 1 tablet by mouth daily. (Patient not taking: Reported on 02/13/2016)   sitaGLIPtin (JANUVIA) 100 MG tablet Take 100 mg by mouth daily.   No current facility-administered medications for this visit. (Other)      REVIEW OF SYSTEMS: ROS    Positive for: Endocrine   Last edited by [name] on 09/06/2020  9:17 AM. (History)       ALLERGIES No Known Allergies  PAST MEDICAL HISTORY Past  Medical History:  Diagnosis Date   Depression    Diabetes mellitus    TYPE II   Hypercholesterolemia    Serous cystadenofibroma    OF RIGHT OVARY,, WITH SCATTERED FOCI, SEROUS BODERLINE TUMOR.   Past Surgical History:  Procedure Laterality Date   PELVIC LAPAROSCOPY     RIGHT OVARIAN CYSTECTOMY, PARTIAL OOPHORECTOMY    FAMILY HISTORY Family History  Problem Relation Age of Onset   Diabetes Maternal Grandmother    Hypertension Maternal Grandfather    Cancer Maternal Grandfather        COLON, SKIN    SOCIAL HISTORY Social History   Tobacco Use   Smoking status: Never Smoker   Smokeless tobacco: Never Used  Substance Use Topics   Alcohol use: Yes    Comment: social only   Drug use: No         OPHTHALMIC EXAM: Base Eye Exam    Visual Acuity (Snellen - Linear)      Right Left   Dist [place] 20/40 20/50 -2   Dist ph [place] 20/20 20/30 +       Tonometry (Tonopen, 9:21 AM)      Right Left   Pressure 19 16       Pupils      Dark Light Shape React APD   Right 4 3 Round  Slow None   Left 4 3 Round Slow None       Neuro/Psych    Oriented x3: Yes   Mood/Affect: Normal       Dilation    Left eye: 1.0% Mydriacyl, 2.5% Phenylephrine @ 9:21 AM        Slit Lamp and Fundus Exam    External Exam      Right Left   External Normal Normal       Slit Lamp Exam      Right Left   Lids/Lashes Normal Normal   Conjunctiva/Sclera White and quiet White and quiet   Cornea Clear Clear   Anterior Chamber Deep and quiet Deep and quiet   Iris Round and reactive Round and reactive   Lens Clear Clear   Anterior Vitreous Normal Normal          IMAGING AND PROCEDURES  Imaging and Procedures for 09/06/20  Panretinal Photocoagulation - OS - Left Eye       Time Out Confirmed correct patient, procedure, site, and patient consented.   Anesthesia Topical anesthesia was used. Anesthetic medications included Proparacaine 0.5%.   Laser Information The type of  laser was diode. Color was yellow. The duration in seconds was 0.02. The spot size was 390 microns. Laser power was 280. Total spots was 671.   Post-op The patient tolerated the procedure well. There were no complications. The patient received written and verbal post procedure care education.   Notes OS, anterior retina treated superiorly and superotemporally.                ASSESSMENT/PLAN:  No problem-specific Assessment & Plan notes found for this encounter.      ICD-10-CM   1. Proliferative diabetic retinopathy of left eye with macular edema associated with type 2 diabetes mellitus (HCC)  T41.9622 Panretinal Photocoagulation - OS - Left Eye    1.  PRP #1 OS today delivered superiorly after recent intravitreal intraocular injection Avastin for CSME NPDR.  OS will need repeat evaluation in 3 to 4 weeks and likely intraocular injection Avastin.  2.  OD, improved and stable after recent injection Avastin.  3.  Post injection Avastin next in 3 to 4 weeks OS, it will also need PRP #2  Ophthalmic Meds Ordered this visit:  No orders of the defined types were placed in this encounter.      No follow-ups on file.  There are no Patient Instructions on file for this visit.   Explained the diagnoses, plan, and follow up with the patient and they expressed understanding.  Patient expressed understanding of the importance of proper follow up care.   Clent Demark Mathilda Maguire M.D. Diseases & Surgery of the Retina and Vitreous Retina & Diabetic [place] 09/06/20     Abbreviations: M myopia (nearsighted); A astigmatism; H hyperopia (farsighted); P presbyopia; Mrx spectacle prescription;  CTL contact lenses; OD right eye; OS left eye; OU both eyes  XT exotropia; ET esotropia; PEK punctate epithelial keratitis; PEE punctate epithelial erosions; DES dry eye syndrome; MGD meibomian gland dysfunction; ATs artificial tears; PFAT's preservative free artificial tears; [place] nuclear sclerotic  cataract; PSC posterior subcapsular cataract; ERM epi-retinal membrane; PVD posterior vitreous detachment; RD retinal detachment; DM diabetes mellitus; DR diabetic retinopathy; NPDR non-proliferative diabetic retinopathy; PDR proliferative diabetic retinopathy; CSME clinically significant macular edema; DME diabetic macular edema; dbh dot blot hemorrhages; CWS cotton wool spot; POAG primary open angle glaucoma; C/D cup-to-disc ratio; HVF humphrey visual field; GVF goldmann visual field;  OCT optical coherence tomography; IOP intraocular pressure; BRVO Branch retinal vein occlusion; CRVO central retinal vein occlusion; CRAO central retinal artery occlusion; BRAO branch retinal artery occlusion; RT retinal tear; SB scleral buckle; PPV pars plana vitrectomy; VH Vitreous hemorrhage; PRP panretinal laser photocoagulation; IVK intravitreal kenalog; VMT vitreomacular traction; MH Macular hole;  NVD neovascularization of the disc; NVE neovascularization elsewhere; AREDS age related eye disease study; ARMD age related macular degeneration; POAG primary open angle glaucoma; EBMD epithelial/anterior basement membrane dystrophy; ACIOL anterior chamber intraocular lens; IOL intraocular lens; PCIOL posterior chamber intraocular lens; Phaco/IOL phacoemulsification with intraocular lens placement; [place] photorefractive keratectomy; LASIK laser assisted in situ keratomileusis; HTN hypertension; DM diabetes mellitus; COPD chronic obstructive pulmonary disease

## 2020-10-04 ENCOUNTER — Other Ambulatory Visit: Payer: Self-pay

## 2020-10-04 ENCOUNTER — Ambulatory Visit (INDEPENDENT_AMBULATORY_CARE_PROVIDER_SITE_OTHER): Payer: 59 | Admitting: Ophthalmology

## 2020-10-04 ENCOUNTER — Encounter (INDEPENDENT_AMBULATORY_CARE_PROVIDER_SITE_OTHER): Payer: Self-pay | Admitting: Ophthalmology

## 2020-10-04 DIAGNOSIS — E113512 Type 2 diabetes mellitus with proliferative diabetic retinopathy with macular edema, left eye: Secondary | ICD-10-CM

## 2020-10-04 DIAGNOSIS — E113411 Type 2 diabetes mellitus with severe nonproliferative diabetic retinopathy with macular edema, right eye: Secondary | ICD-10-CM

## 2020-10-04 MED ORDER — BEVACIZUMAB CHEMO INJECTION 1.25MG/0.05ML SYRINGE FOR KALEIDOSCOPE
1.2500 mg | INTRAVITREAL | Status: AC | PRN
  Administered 2020-10-04: 1.25 mg via INTRAVITREAL

## 2020-10-04 NOTE — Patient Instructions (Signed)
Patient will schedule follow-up also in 5 weeks to repeat macular evaluation left eye and possible injection intravitreal Avastin OS

## 2020-10-04 NOTE — Assessment & Plan Note (Signed)
OS, diffuse CSME vastly improved post initial injection 9 20-21, yet recurrent since last OCT Dated 08/29/2020.  Repeat intravitreal Avastin OS today

## 2020-10-04 NOTE — Assessment & Plan Note (Signed)
CSME OD focally has improved after recent injection Avastin.  Noncenter involvement suggest focal laser treatment soon will prevent this from recurring and decrease the patient's treatment burden for OD

## 2020-10-04 NOTE — Progress Notes (Signed)
10/04/2020     CHIEF COMPLAINT Patient presents for Retina Follow Up   HISTORY OF PRESENT ILLNESS: Tiffany Mills is a 34 y.o. female who presents to the clinic today for:   HPI    Retina Follow Up    Patient presents with  Diabetic Retinopathy.  In left eye.  This started 4 weeks ago.  Severity is mild.  Duration of 4 weeks.  Since onset it is stable.          Comments    4 Week Diabetic F/U OS, poss Avastin OS  Pt denies noticeable changes to [place] OU since last visit. Pt denies ocular pain, flashes of light, or floaters OU.  LBS: has not checked        Last edited by Rockie Neighbours, Drew on 10/04/2020  9:31 AM. (History)      Referring physician: Vicenta Aly, [place],  [place] [id number]  HISTORICAL INFORMATION:   Selected notes from the [place]: No current outpatient medications on file. (Ophthalmic Drugs)   No current facility-administered medications for this visit. (Ophthalmic Drugs)   Current Outpatient Medications (Other)  Medication Sig  . benazepril (LOTENSIN) 5 MG tablet Take 5 mg by mouth daily.  Marland Kitchen buPROPion (WELLBUTRIN XL) 300 MG 24 hr tablet Take 300 mg by mouth daily. Reported on 02/13/2016  . fenofibrate 160 MG tablet Take 160 mg by mouth daily. Reported on 02/13/2016  . gemfibrozil (LOPID) 600 MG tablet Take 600 mg by mouth 2 (two) times daily before a meal.  . metFORMIN (GLUCOPHAGE) 1000 MG tablet Take 1,000 mg by mouth 2 (two) times daily with a meal.  . norgestimate-ethinyl estradiol (SPRINTEC 28) 0.25-35 MG-MCG tablet Take 1 tablet by mouth daily. (Patient not taking: Reported on 02/13/2016)  . sitaGLIPtin (JANUVIA) 100 MG tablet Take 100 mg by mouth daily.   No current facility-administered medications for this visit. (Other)      REVIEW OF SYSTEMS:    ALLERGIES No Known Allergies  PAST MEDICAL HISTORY Past Medical History:  Diagnosis Date  . Depression    . Diabetes mellitus    TYPE II  . Hypercholesterolemia   . Serous cystadenofibroma    OF RIGHT OVARY,, WITH SCATTERED FOCI, SEROUS BODERLINE TUMOR.   Past Surgical History:  Procedure Laterality Date  . PELVIC LAPAROSCOPY     RIGHT OVARIAN CYSTECTOMY, PARTIAL OOPHORECTOMY    FAMILY HISTORY Family History  Problem Relation Age of Onset  . Diabetes Maternal Grandmother   . Hypertension Maternal Grandfather   . Cancer Maternal Grandfather        COLON, SKIN    SOCIAL HISTORY Social History   Tobacco Use  . Smoking status: Never Smoker  . Smokeless tobacco: Never Used  Substance Use Topics  . Alcohol use: Yes    Comment: social only  . Drug use: No         OPHTHALMIC EXAM:  Base Eye Exam    Visual Acuity (ETDRS)      Right Left   Dist [place] 20/40 20/50   Dist ph [place] 20/25 +2 20/25       Tonometry (Tonopen, 9:32 AM)      Right Left   Pressure 18 18       Pupils      Pupils Dark Light Shape React APD   Right PERRL 4 3 Round Slow None   Left PERRL  4 3 Round Slow None       Visual Fields (Counting fingers)      Left Right    Full Full       Extraocular Movement      Right Left    Full Full       Neuro/Psych    Oriented x3: Yes   Mood/Affect: Normal       Dilation    Left eye: 1.0% Mydriacyl, 2.5% Phenylephrine @ 9:34 AM        Slit Lamp and Fundus Exam    External Exam      Right Left   External Normal Normal       Slit Lamp Exam      Right Left   Lids/Lashes Normal Normal   Conjunctiva/Sclera White and quiet White and quiet   Cornea Clear Clear   Anterior Chamber Deep and quiet Deep and quiet   Iris Round and reactive Round and reactive   Lens Clear Clear   Anterior Vitreous Normal Normal       Fundus Exam      Right Left   Posterior Vitreous  Normal   Disc  Neovascularization >10-A   C/D Ratio  0.25   Macula  Macular thickening, Microaneurysms, severe clinically significant macular edema   Vessels  PDR-active   Periphery   Normal, good PRP anteriorly, superiorly, room nasally and inferiorly for more          IMAGING AND PROCEDURES  Imaging and Procedures for 10/04/20  OCT, Retina - OU - Both Eyes       Right Eye Quality was good. Scan locations included subfoveal. Central Foveal Thickness: 274. Progression has improved.   Left Eye Quality was good. Scan locations included subfoveal. Central Foveal Thickness: 331.   Notes OD, CSME much improved, post Avastin, watch temporally,  OS, diffuse CSME vastly improved post initial injection 9 20-21, yet recurrent since last OCT Dated 08/29/2020.  Repeat intravitreal Avastin OS today       Intravitreal Injection, Pharmacologic Agent - OS - Left Eye       Time Out 10/04/2020. 10:20 AM. Confirmed correct patient, procedure, site, and patient consented.   Anesthesia Topical anesthesia was used. Anesthetic medications included [place] 3.5%.   Procedure Preparation included Tobramycin 0.3%, 10% betadine to eyelids, 5% betadine to ocular surface. A 30 gauge needle was used.   Injection:  1.25 mg Bevacizumab (AVASTIN) SOLN   NDC: 70360-001-02, Lot: [id number]   Route: Intravitreal, Site: Left Eye, Waste: 0 mg  Post-op Post injection exam found visual acuity of at least counting fingers. The patient tolerated the procedure well. There were no complications. The patient received written and verbal post procedure care education. Post injection medications were not given.                 ASSESSMENT/PLAN:  Proliferative diabetic retinopathy of left eye with macular edema associated with type 2 diabetes mellitus (HCC) OS, diffuse CSME vastly improved post initial injection 9 20-21, yet recurrent since last OCT Dated 08/29/2020.  Repeat intravitreal Avastin OS today    Severe nonproliferative diabetic retinopathy of right eye, with macular edema, associated with type 2 diabetes mellitus (HCC) CSME OD focally has improved after recent injection  Avastin.  Noncenter involvement suggest focal laser treatment soon will prevent this from recurring and decrease the patient's treatment burden for OD      ICD-10-CM   1. Proliferative diabetic retinopathy of left eye with macular edema associated  with type 2 diabetes mellitus (HCC)  S96.2836 OCT, Retina - OU - Both Eyes    Intravitreal Injection, Pharmacologic Agent - OS - Left Eye    Bevacizumab (AVASTIN) SOLN 1.25 mg  2. Severe nonproliferative diabetic retinopathy of right eye, with macular edema, associated with type 2 diabetes mellitus ([place])  E11.3411     1.  Repeat injection intravitreal Avastin OS today for CSME associated with PDR.  Notably PDR slightly less active with recent PRP #1 superiorly 2.  OD, CSME vastly improved some 5 weeks post injection for noncenter involved CSME.  Follow-up in 1 week for focal laser treatment temporal to the fovea OD  3.  OS, follow-up in 5 weeks for reevaluation of macula possible injection Avastin  Ophthalmic Meds Ordered this visit:  Meds ordered this encounter  Medications  . Bevacizumab (AVASTIN) SOLN 1.25 mg       Return in about 1 week (around 10/11/2020) for dilate, OD, FOCAL.  Patient Instructions  Patient will schedule follow-up also in 5 weeks to repeat macular evaluation left eye and possible injection intravitreal Avastin OS    Explained the diagnoses, plan, and follow up with the patient and they expressed understanding.  Patient expressed understanding of the importance of proper follow up care.   Clent Demark Jarquez Mestre M.D. Diseases & Surgery of the Retina and Vitreous Retina & Diabetic [place] 10/04/20     Abbreviations: M myopia (nearsighted); A astigmatism; H hyperopia (farsighted); P presbyopia; Mrx spectacle prescription;  CTL contact lenses; OD right eye; OS left eye; OU both eyes  XT exotropia; ET esotropia; PEK punctate epithelial keratitis; PEE punctate epithelial erosions; DES dry eye syndrome; MGD meibomian gland  dysfunction; ATs artificial tears; PFAT's preservative free artificial tears; [place] nuclear sclerotic cataract; PSC posterior subcapsular cataract; ERM epi-retinal membrane; PVD posterior vitreous detachment; RD retinal detachment; DM diabetes mellitus; DR diabetic retinopathy; NPDR non-proliferative diabetic retinopathy; PDR proliferative diabetic retinopathy; CSME clinically significant macular edema; DME diabetic macular edema; dbh dot blot hemorrhages; CWS cotton wool spot; POAG primary open angle glaucoma; C/D cup-to-disc ratio; HVF humphrey visual field; GVF goldmann visual field; OCT optical coherence tomography; IOP intraocular pressure; BRVO Branch retinal vein occlusion; CRVO central retinal vein occlusion; CRAO central retinal artery occlusion; BRAO branch retinal artery occlusion; RT retinal tear; SB scleral buckle; PPV pars plana vitrectomy; VH Vitreous hemorrhage; PRP panretinal laser photocoagulation; IVK intravitreal kenalog; VMT vitreomacular traction; MH Macular hole;  NVD neovascularization of the disc; NVE neovascularization elsewhere; AREDS age related eye disease study; ARMD age related macular degeneration; POAG primary open angle glaucoma; EBMD epithelial/anterior basement membrane dystrophy; ACIOL anterior chamber intraocular lens; IOL intraocular lens; PCIOL posterior chamber intraocular lens; Phaco/IOL phacoemulsification with intraocular lens placement; [place] photorefractive keratectomy; LASIK laser assisted in situ keratomileusis; HTN hypertension; DM diabetes mellitus; COPD chronic obstructive pulmonary disease

## 2020-10-10 ENCOUNTER — Encounter (INDEPENDENT_AMBULATORY_CARE_PROVIDER_SITE_OTHER): Payer: Self-pay | Admitting: Ophthalmology

## 2020-10-10 ENCOUNTER — Other Ambulatory Visit: Payer: Self-pay

## 2020-10-10 ENCOUNTER — Ambulatory Visit (INDEPENDENT_AMBULATORY_CARE_PROVIDER_SITE_OTHER): Payer: 59 | Admitting: Ophthalmology

## 2020-10-10 DIAGNOSIS — E113411 Type 2 diabetes mellitus with severe nonproliferative diabetic retinopathy with macular edema, right eye: Secondary | ICD-10-CM

## 2020-10-10 NOTE — Progress Notes (Signed)
10/10/2020     CHIEF COMPLAINT Patient presents for Retina Follow Up   HISTORY OF PRESENT ILLNESS: Tiffany Mills is a 34 y.o. female who presents to the clinic today for:   HPI    Retina Follow Up    Patient presents with  Diabetic Retinopathy.  In right eye.  Severity is severe.  Since onset it is stable.  I, the attending physician,  performed the HPI with the patient and updated documentation appropriately.          Comments    Focal OD. OCT  Pt states no changes in vision since last exam. BGL: did not check       Last edited by [name] on 10/10/2020  3:00 PM. (History)      Referring physician: [name], NP [place],  [place] [id number]  HISTORICAL INFORMATION:   Selected notes from the [name]: No current outpatient medications on file. (Ophthalmic Drugs)   No current facility-administered medications for this visit. (Ophthalmic Drugs)   Current Outpatient Medications (Other)  Medication Sig  . benazepril (LOTENSIN) 5 MG tablet Take 5 mg by mouth daily.  Marland Kitchen buPROPion (WELLBUTRIN XL) 300 MG 24 hr tablet Take 300 mg by mouth daily. Reported on 02/13/2016  . fenofibrate 160 MG tablet Take 160 mg by mouth daily. Reported on 02/13/2016  . gemfibrozil (LOPID) 600 MG tablet Take 600 mg by mouth 2 (two) times daily before a meal.  . metFORMIN (GLUCOPHAGE) 1000 MG tablet Take 1,000 mg by mouth 2 (two) times daily with a meal.  . norgestimate-ethinyl estradiol (SPRINTEC 28) 0.25-35 MG-MCG tablet Take 1 tablet by mouth daily. (Patient not taking: Reported on 02/13/2016)  . sitaGLIPtin (JANUVIA) 100 MG tablet Take 100 mg by mouth daily.   No current facility-administered medications for this visit. (Other)      REVIEW OF SYSTEMS: ROS    Positive for: Endocrine   Last edited by [name] on 10/10/2020  3:00 PM. (History)       ALLERGIES No Known Allergies  PAST MEDICAL  HISTORY Past Medical History:  Diagnosis Date  . Depression   . Diabetes mellitus    TYPE II  . Hypercholesterolemia   . Serous cystadenofibroma    OF RIGHT OVARY,, WITH SCATTERED FOCI, SEROUS BODERLINE TUMOR.   Past Surgical History:  Procedure Laterality Date  . PELVIC LAPAROSCOPY     RIGHT OVARIAN CYSTECTOMY, PARTIAL OOPHORECTOMY    FAMILY HISTORY Family History  Problem Relation Age of Onset  . Diabetes Maternal Grandmother   . Hypertension Maternal Grandfather   . Cancer Maternal Grandfather        COLON, SKIN    SOCIAL HISTORY Social History   Tobacco Use  . Smoking status: Never Smoker  . Smokeless tobacco: Never Used  Substance Use Topics  . Alcohol use: Yes    Comment: social only  . Drug use: No         OPHTHALMIC EXAM:  Base Eye Exam    Visual Acuity (Snellen - Linear)      Right Left   Dist [place] 20/40 20/60   Dist ph [place] 20/20 -2 20/30       Tonometry (Tonopen, 3:03 PM)      Right Left   Pressure 16 13       Neuro/Psych    Oriented x3: Yes   Mood/Affect: Normal  Dilation    Right eye: 1.0% Mydriacyl, 2.5% Phenylephrine @ 3:04 PM        Slit Lamp and Fundus Exam    External Exam      Right Left   External Normal Normal       Slit Lamp Exam      Right Left   Lids/Lashes Normal Normal   Conjunctiva/Sclera White and quiet White and quiet   Cornea Clear Clear   Anterior Chamber Deep and quiet Deep and quiet   Iris Round and reactive Round and reactive   Lens Clear Clear   Anterior Vitreous Normal Normal          IMAGING AND PROCEDURES  Imaging and Procedures for 10/10/20  OCT, Retina - OU - Both Eyes       Right Eye Quality was good. Scan locations included subfoveal. Central Foveal Thickness: 273. Progression has improved.   Left Eye Quality was good. Scan locations included subfoveal. Central Foveal Thickness: 314. Progression has improved.   Notes OD with small residual CSME temporal to the fovea.  Overall  improved from last injection 08/29/2020 OD.  We will treat with focal to this region from 9 center involved CSME, in order to decrease treatment burden overall  OS CSME much improved after recent injection of Avastin for CSME,         Focal Laser - OD - Right Eye       Time Out Confirmed correct patient, procedure, site, and patient consented.   Anesthesia Topical anesthesia was used. Anesthetic medications included Proparacaine 0.5%.   Laser Information The type of laser was diode. Color was yellow. The duration in seconds was 0.1. The spot size was 100 microns. Laser power was 60. Total spots was 64.   Post-op The patient tolerated the procedure well. There were no complications. The patient received written and verbal post procedure care education.   Notes Focal applied with OCT guidance temporally                ASSESSMENT/PLAN:  No problem-specific Assessment & Plan notes found for this encounter.      ICD-10-CM   1. Severe nonproliferative diabetic retinopathy of right eye, with macular edema, associated with type 2 diabetes mellitus (HCC)  E11.3411 OCT, Retina - OU - Both Eyes    Focal Laser - OD - Right Eye    1.  OD with noncenter involved focal CSME.  Improved some 5 to 6 weeks after recent Avastin.  We will treat this region today with limited focal laser in order to decrease treatment burden and visits for antivegF OD  2.  Follow-up as scheduled December 2021 dilate OS  3.  Ophthalmic Meds Ordered this visit:  No orders of the defined types were placed in this encounter.      Return for As scheduled, dilate, OS, AVASTIN OCT.  There are no Patient Instructions on file for this visit.   Explained the diagnoses, plan, and follow up with the patient and they expressed understanding.  Patient expressed understanding of the importance of proper follow up care.   Clent [name] [name] Diseases & Surgery of the Retina and Vitreous Retina & Diabetic  [place] 10/10/20     Abbreviations: M myopia (nearsighted); A astigmatism; H hyperopia (farsighted); P presbyopia; Mrx spectacle prescription;  CTL contact lenses; OD right eye; OS left eye; OU both eyes  XT exotropia; ET esotropia; PEK punctate epithelial keratitis; PEE punctate epithelial erosions; DES dry eye  syndrome; MGD meibomian gland dysfunction; ATs artificial tears; PFAT's preservative free artificial tears; [place] nuclear sclerotic cataract; PSC posterior subcapsular cataract; ERM epi-retinal membrane; PVD posterior vitreous detachment; RD retinal detachment; DM diabetes mellitus; DR diabetic retinopathy; NPDR non-proliferative diabetic retinopathy; PDR proliferative diabetic retinopathy; CSME clinically significant macular edema; DME diabetic macular edema; dbh dot blot hemorrhages; CWS cotton wool spot; POAG primary open angle glaucoma; C/D cup-to-disc ratio; HVF humphrey visual field; GVF goldmann visual field; OCT optical coherence tomography; IOP intraocular pressure; BRVO Branch retinal vein occlusion; CRVO central retinal vein occlusion; CRAO central retinal artery occlusion; BRAO branch retinal artery occlusion; RT retinal tear; SB scleral buckle; PPV pars plana vitrectomy; VH Vitreous hemorrhage; PRP panretinal laser photocoagulation; IVK intravitreal kenalog; VMT vitreomacular traction; MH Macular hole;  NVD neovascularization of the disc; NVE neovascularization elsewhere; AREDS age related eye disease study; ARMD age related macular degeneration; POAG primary open angle glaucoma; EBMD epithelial/anterior basement membrane dystrophy; ACIOL anterior chamber intraocular lens; IOL intraocular lens; PCIOL posterior chamber intraocular lens; Phaco/IOL phacoemulsification with intraocular lens placement; [place] photorefractive keratectomy; LASIK laser assisted in situ keratomileusis; HTN hypertension; DM diabetes mellitus; COPD chronic obstructive pulmonary disease

## 2020-10-11 ENCOUNTER — Encounter (INDEPENDENT_AMBULATORY_CARE_PROVIDER_SITE_OTHER): Payer: 59 | Admitting: Ophthalmology

## 2020-10-28 ENCOUNTER — Emergency Department (HOSPITAL_BASED_OUTPATIENT_CLINIC_OR_DEPARTMENT_OTHER): Payer: 59

## 2020-10-28 ENCOUNTER — Emergency Department (HOSPITAL_BASED_OUTPATIENT_CLINIC_OR_DEPARTMENT_OTHER)
Admission: EM | Admit: 2020-10-28 | Discharge: 2020-10-28 | Disposition: A | Discharge status: Home / Self Care | Payer: 59 | Attending: Emergency Medicine | Admitting: Emergency Medicine

## 2020-10-28 ENCOUNTER — Other Ambulatory Visit: Payer: Self-pay

## 2020-10-28 ENCOUNTER — Encounter (HOSPITAL_BASED_OUTPATIENT_CLINIC_OR_DEPARTMENT_OTHER): Payer: Self-pay

## 2020-10-28 DIAGNOSIS — Z79899 Other long term (current) drug therapy: Secondary | ICD-10-CM | POA: Diagnosis not present

## 2020-10-28 DIAGNOSIS — M549 Dorsalgia, unspecified: Secondary | ICD-10-CM | POA: Diagnosis not present

## 2020-10-28 DIAGNOSIS — N202 Calculus of kidney with calculus of ureter: Secondary | ICD-10-CM | POA: Insufficient documentation

## 2020-10-28 DIAGNOSIS — R109 Unspecified abdominal pain: Secondary | ICD-10-CM | POA: Diagnosis present

## 2020-10-28 DIAGNOSIS — I10 Essential (primary) hypertension: Secondary | ICD-10-CM | POA: Diagnosis not present

## 2020-10-28 DIAGNOSIS — E113411 Type 2 diabetes mellitus with severe nonproliferative diabetic retinopathy with macular edema, right eye: Secondary | ICD-10-CM | POA: Diagnosis not present

## 2020-10-28 DIAGNOSIS — E113512 Type 2 diabetes mellitus with proliferative diabetic retinopathy with macular edema, left eye: Secondary | ICD-10-CM | POA: Diagnosis not present

## 2020-10-28 DIAGNOSIS — Z7984 Long term (current) use of oral hypoglycemic drugs: Secondary | ICD-10-CM | POA: Insufficient documentation

## 2020-10-28 DIAGNOSIS — N201 Calculus of ureter: Secondary | ICD-10-CM

## 2020-10-28 LAB — COMPREHENSIVE METABOLIC PANEL
ALT: 69 U/L — ABNORMAL HIGH (ref 0–44)
AST: 53 U/L — ABNORMAL HIGH (ref 15–41)
Albumin: 4.2 g/dL (ref 3.5–5.0)
Alkaline Phosphatase: 66 U/L (ref 38–126)
Anion gap: 13 (ref 5–15)
BUN: 11 mg/dL (ref 6–20)
CO2: 22 mmol/L (ref 22–32)
Calcium: 8.6 mg/dL — ABNORMAL LOW (ref 8.9–10.3)
Chloride: 98 mmol/L (ref 98–111)
Creatinine, Ser: 0.76 mg/dL (ref 0.44–1.00)
GFR, Estimated: >60 mL/min (ref >60)
Glucose, Bld: 290 mg/dL — ABNORMAL HIGH (ref 70–99)
Potassium: 3.7 mmol/L (ref 3.5–5.1)
Sodium: 133 mmol/L — ABNORMAL LOW (ref 135–145)
Total Bilirubin: 0.8 mg/dL (ref 0.3–1.2)
Total Protein: 8.2 g/dL — ABNORMAL HIGH (ref 6.5–8.1)

## 2020-10-28 LAB — CBC WITH DIFFERENTIAL/PLATELET
Abs Immature Granulocytes: 0.02 10*3/uL (ref 0.00–0.07)
Basophils Absolute: 0 10*3/uL (ref 0.0–0.1)
Basophils Relative: 0 %
Eosinophils Absolute: 0 10*3/uL (ref 0.0–0.5)
Eosinophils Relative: 0 %
HCT: 42.7 % (ref 36.0–46.0)
Hemoglobin: 14.8 g/dL (ref 12.0–15.0)
Immature Granulocytes: 0 %
Lymphocytes Relative: 12 %
Lymphs Abs: 0.8 10*3/uL (ref 0.7–4.0)
MCH: 31.6 pg (ref 26.0–34.0)
MCHC: 34.7 g/dL (ref 30.0–36.0)
MCV: 91.2 fL (ref 80.0–100.0)
Monocytes Absolute: 0.4 10*3/uL (ref 0.1–1.0)
Monocytes Relative: 6 %
Neutro Abs: 5 10*3/uL (ref 1.7–7.7)
Neutrophils Relative %: 82 %
Platelets: 227 10*3/uL (ref 150–400)
RBC: 4.68 MIL/uL (ref 3.87–5.11)
RDW: 12.2 % (ref 11.5–15.5)
WBC: 6.2 10*3/uL (ref 4.0–10.5)
nRBC: 0 % (ref 0.0–0.2)

## 2020-10-28 LAB — URINALYSIS, ROUTINE W REFLEX MICROSCOPIC
Bilirubin Urine: NEGATIVE
Glucose, UA: >=500 mg/dL — AB
Ketones, ur: >80 mg/dL — AB
Leukocytes,Ua: NEGATIVE
Nitrite: NEGATIVE
Protein, ur: NEGATIVE mg/dL
Specific Gravity, Urine: >1.03 — ABNORMAL HIGH (ref 1.005–1.030)
pH: 5.5 (ref 5.0–8.0)

## 2020-10-28 LAB — PREGNANCY, URINE: Preg Test, Ur: NEGATIVE

## 2020-10-28 LAB — LIPASE, BLOOD: Lipase: 43 U/L (ref 11–51)

## 2020-10-28 LAB — URINALYSIS, MICROSCOPIC (REFLEX): RBC / HPF: >50 RBC/hpf (ref 0–5)

## 2020-10-28 MED ORDER — FENTANYL CITRATE (PF) 100 MCG/2ML IJ SOLN
100.0000 ug | Freq: Once | INTRAMUSCULAR | Status: AC
  Administered 2020-10-28: 100 ug via INTRAVENOUS
  Filled 2020-10-28: qty 2

## 2020-10-28 MED ORDER — ONDANSETRON 4 MG PO TBDP: 4.0000 mg | ORAL_TABLET | Freq: Three times a day (TID) | ORAL | 0 refills | Status: AC | PRN

## 2020-10-28 MED ORDER — TAMSULOSIN HCL 0.4 MG PO CAPS: 0.4000 mg | ORAL_CAPSULE | Freq: Every day | ORAL | 0 refills | Status: DC

## 2020-10-28 MED ORDER — KETOROLAC TROMETHAMINE 30 MG/ML IJ SOLN
30.0000 mg | Freq: Once | INTRAMUSCULAR | Status: AC
  Administered 2020-10-28: 30 mg via INTRAVENOUS
  Filled 2020-10-28: qty 1

## 2020-10-28 MED ORDER — ONDANSETRON HCL 4 MG/2ML IJ SOLN
4.0000 mg | Freq: Once | INTRAMUSCULAR | Status: AC
  Administered 2020-10-28: 4 mg via INTRAVENOUS
  Filled 2020-10-28: qty 2

## 2020-10-28 MED ORDER — TAMSULOSIN HCL 0.4 MG PO CAPS: 0.4000 mg | ORAL_CAPSULE | Freq: Every day | ORAL | 0 refills | Status: AC

## 2020-10-28 MED ORDER — OXYCODONE-ACETAMINOPHEN 5-325 MG PO TABS
2.0000 | ORAL_TABLET | ORAL | 0 refills | Status: AC | PRN
Start: 2020-10-28 — End: 2020-11-02

## 2020-10-28 MED ORDER — ONDANSETRON 4 MG PO TBDP: 4.0000 mg | ORAL_TABLET | Freq: Three times a day (TID) | ORAL | 0 refills | Status: DC | PRN

## 2020-10-28 MED ORDER — OXYCODONE-ACETAMINOPHEN 5-325 MG PO TABS
2.0000 | ORAL_TABLET | ORAL | 0 refills | Status: DC | PRN
Start: 2020-10-28 — End: 2020-10-28

## 2020-10-28 NOTE — ED Provider Notes (Signed)
[facility] EMERGENCY DEPARTMENT Provider Note   CSN: [id number] Arrival date & time: 10/28/20  1813     History Chief Complaint  Patient presents with  . Flank Pain    Tiffany Mills is a 34 y.o. female.  34 year old female with past medical history below including type 2 diabetes mellitus, hyperlipidemia, hypertension who presents with flank pain.  Around 9 AM this morning, patient began having left-sided flank/back pain and shortly afterwards had vomiting.  She has continued to have intermittent flank pain that is nonradiating and not associated with any abdominal pain.  She was evaluated at urgent care where she was told she had blood in her urine.  She has not noticed any urinary symptoms.  She denies any diarrhea, fevers, or URI symptoms.  Of note, patient has distant history of kidney stone for which she had lithotripsy in the past but has not had any problems over recent years.  The history is provided by the patient.  Flank Pain       Past Medical History:  Diagnosis Date  . Depression   . Diabetes mellitus    TYPE II  . Hypercholesterolemia   . Serous cystadenofibroma    OF RIGHT OVARY,, WITH SCATTERED FOCI, SEROUS BODERLINE TUMOR.    Patient Active Problem List   Diagnosis Date Noted  . Proliferative diabetic retinopathy of left eye with macular edema associated with type 2 diabetes mellitus ([place]) 08/17/2020  . Severe nonproliferative diabetic retinopathy of right eye, with macular edema, associated with type 2 diabetes mellitus ([place]) 08/17/2020  . Ovarian cystadenoma 07/09/2012  . Diabetes mellitus type II 07/09/2012  . HTN (hypertension) 07/09/2012  . Hypercholesterolemia 07/09/2012    Past Surgical History:  Procedure Laterality Date  . PELVIC LAPAROSCOPY     RIGHT OVARIAN CYSTECTOMY, PARTIAL OOPHORECTOMY     OB History    Gravida  0   Para      Term      Preterm      AB      Living        SAB      TAB      Ectopic       Multiple      Live Births              Family History  Problem Relation Age of Onset  . Diabetes Maternal Grandmother   . Hypertension Maternal Grandfather   . Cancer Maternal Grandfather        COLON, SKIN    Social History   Tobacco Use  . Smoking status: Never Smoker  . Smokeless tobacco: Never Used  Substance Use Topics  . Alcohol use: Not Currently  . Drug use: No    Home Medications Prior to Admission medications   Medication Sig Start Date End Date Taking? Authorizing Provider  benazepril (LOTENSIN) 5 MG tablet Take 5 mg by mouth daily.    [provider]  buPROPion (WELLBUTRIN XL) 300 MG 24 hr tablet Take 300 mg by mouth daily. Reported on 02/13/2016    [provider]  fenofibrate 160 MG tablet Take 160 mg by mouth daily. Reported on 02/13/2016    [provider]  gemfibrozil (LOPID) 600 MG tablet Take 600 mg by mouth 2 (two) times daily before a meal.    [provider]  metFORMIN (GLUCOPHAGE) 1000 MG tablet Take 1,000 mg by mouth 2 (two) times daily with a meal.    [provider]  norgestimate-ethinyl  estradiol (SPRINTEC 28) 0.25-35 MG-MCG tablet Take 1 tablet by mouth daily. Patient not taking: Reported on 02/13/2016 07/16/13   Terrance Mass, MD  ondansetron (ZOFRAN ODT) 4 MG disintegrating tablet Take 1 tablet (4 mg total) by mouth every 8 (eight) hours as needed for nausea or vomiting. 10/28/20   Raylinn Kosar, Wenda Overland, MD  oxyCODONE-acetaminophen (PERCOCET) 5-325 MG tablet Take 2 tablets by mouth every 4 (four) hours as needed for up to 5 days for severe pain. 10/28/20 11/02/20  Zissel Biederman, Wenda Overland, MD  sitaGLIPtin (JANUVIA) 100 MG tablet Take 100 mg by mouth daily.    [provider]  tamsulosin (FLOMAX) 0.4 MG CAPS capsule Take 1 capsule (0.4 mg total) by mouth daily. 10/28/20   Miracle Criado, Wenda Overland, MD    Allergies    Patient has no known allergies.  Review of Systems   Review of Systems   Genitourinary: Positive for flank pain.  All other systems reviewed and are negative except that which was mentioned in HPI   Physical Exam Updated Vital Signs BP 127/84 (BP Location: Left Arm)   Pulse (!) 104   Temp 98.2 F (36.8 C) (Oral)   Resp 18   Ht 5\' 4"  (1.626 m)   Wt 104.3 kg   LMP 09/12/2020   SpO2 94%   BMI 39.48 kg/m   Physical Exam Vitals and nursing note reviewed.  Constitutional:      General: She is not in acute distress.    Appearance: She is well-developed.  HENT:     Head: Normocephalic and atraumatic.  Eyes:     Conjunctiva/sclera: Conjunctivae normal.  Cardiovascular:     Rate and Rhythm: Normal rate and regular rhythm.     Heart sounds: Normal heart sounds. No murmur heard.   Pulmonary:     Effort: Pulmonary effort is normal.     Breath sounds: Normal breath sounds.  Abdominal:     General: Bowel sounds are normal. There is no distension.     Palpations: Abdomen is soft.     Tenderness: There is no abdominal tenderness.  Musculoskeletal:     Cervical back: Neck supple.  Skin:    General: Skin is warm and dry.  Neurological:     Mental Status: She is alert and oriented to person, place, and time.     Comments: Fluent speech  Psychiatric:        Judgment: Judgment normal.     ED Results / Procedures / Treatments   Labs (all labs ordered are listed, but only abnormal results are displayed) Labs Reviewed  COMPREHENSIVE METABOLIC PANEL - Abnormal; Notable for the following components:      Result Value   Sodium 133 (*)    Glucose, Bld 290 (*)    Calcium 8.6 (*)    Total Protein 8.2 (*)    AST 53 (*)    ALT 69 (*)    All other components within normal limits  URINALYSIS, ROUTINE W REFLEX MICROSCOPIC - Abnormal; Notable for the following components:   APPearance CLOUDY (*)    Specific Gravity, Urine >1.030 (*)    Glucose, UA >=500 (*)    Hgb urine dipstick LARGE (*)    Ketones, ur >80 (*)    All other components within normal  limits  URINALYSIS, MICROSCOPIC (REFLEX) - Abnormal; Notable for the following components:   Bacteria, UA MANY (*)    All other components within normal limits  LIPASE, BLOOD  PREGNANCY, URINE  CBC WITH DIFFERENTIAL/PLATELET  EKG None  Radiology CT Renal Stone Study  Result Date: 10/28/2020 CLINICAL DATA:  Left-sided flank pain. EXAM: CT ABDOMEN AND PELVIS WITHOUT CONTRAST TECHNIQUE: Multidetector CT imaging of the abdomen and pelvis was performed following the standard protocol without IV contrast. COMPARISON:  Apr 07, 2009 FINDINGS: Lower chest: No acute abnormality. Hepatobiliary: There is diffuse fatty infiltration of the liver parenchyma. No focal liver abnormality is seen. No gallstones, gallbladder wall thickening, or biliary dilatation. Pancreas: Unremarkable. No pancreatic ductal dilatation or surrounding inflammatory changes. Spleen: Normal in size without focal abnormality. Adrenals/Urinary Tract: Adrenal glands are unremarkable. Kidneys are normal in size, without focal lesions. A 7 mm obstructing renal stone is seen within the proximal left ureter with moderate severity left-sided hydronephrosis and perinephric inflammatory fat stranding. A 5 mm nonobstructing renal stone is seen within the lower pole of the left kidney. Bladder is unremarkable. Stomach/Bowel: Stomach is within normal limits. Appendix appears normal. No evidence of bowel wall thickening, distention, or inflammatory changes. Vascular/Lymphatic: No significant vascular findings are present. No enlarged abdominal or pelvic lymph nodes. Reproductive: Uterus is normal in size and appearance. A 2.7 cm x 2.2 cm cystic appearing area is seen within the left adnexa. Other: No abdominal wall hernia or abnormality. No abdominopelvic ascites. Musculoskeletal: No acute or significant osseous findings. IMPRESSION: 1.  7 mm obstructing renal stone within the proximal left ureter. 2.  5 mm nonobstructing renal stone within the left  kidney. 3.  Left adnexal cyst, likely ovarian in origin. 4. Fatty liver. Electronically Signed   By: Virgina Norfolk M.D.   On: 10/28/2020 20:43    Procedures Procedures (including critical care time)  Medications Ordered in ED Medications  ondansetron [facility]) injection 4 mg (4 mg Intravenous Given 10/28/20 2045)  fentaNYL (SUBLIMAZE) injection 100 mcg (100 mcg Intravenous Given 10/28/20 2051)  ketorolac (TORADOL) 30 MG/ML injection 30 mg (30 mg Intravenous Given 10/28/20 2049)    ED Course  I have reviewed the triage vital signs and the nursing notes.  Pertinent labs & imaging results that were available during my care of the patient were reviewed by me and considered in my medical decision making (see chart for details).    MDM Rules/Calculators/A&P                          Nontoxic on exam, reassuring vital signs.  She had no focal abdominal tenderness but did start having vomiting after I updated her abdomen.  Gave fentanyl and Zofran.  Lab work notable for UA with large amount of blood but no evidence of infection.  Creatinine is normal, normal WBC count.  Glucose 290 with no evidence of DKA.  She does have mild elevation of LFTs with AST 53, ALT 69, normal bilirubin.  I discussed these abnormal labs with the patient and emphasized the importance of close PCP follow-up of her liver enzymes.  CT abdomen and pelvis shows 7 mm proximal obstructing left ureteral stone.  Her pain has been well controlled after receiving Toradol and she has tolerated p.o. here.  She feels comfortable following up with her urologist in the clinic and I have extensively reviewed return precautions with her, she voiced understanding. Final Clinical Impression(s) / ED Diagnoses Final diagnoses:  Ureterolithiasis    Rx / DC Orders ED Discharge Orders         Ordered    oxyCODONE-acetaminophen (PERCOCET) 5-325 MG tablet  Every 4 hours PRN,   Status:  Discontinued  10/28/20 2210    ondansetron  (ZOFRAN ODT) 4 MG disintegrating tablet  Every 8 hours PRN,   Status:  Discontinued        10/28/20 2210    tamsulosin (FLOMAX) 0.4 MG CAPS capsule  Daily,   Status:  Discontinued        10/28/20 2210    ondansetron (ZOFRAN ODT) 4 MG disintegrating tablet  Every 8 hours PRN        10/28/20 2225    oxyCODONE-acetaminophen (PERCOCET) 5-325 MG tablet  Every 4 hours PRN        10/28/20 2225    tamsulosin (FLOMAX) 0.4 MG CAPS capsule  Daily        10/28/20 2225           Avaleen Brownley, Wenda Overland, MD 10/28/20 2332

## 2020-10-28 NOTE — ED Triage Notes (Signed)
Pt reports L sided flank pain since 9am this morning. Pt denies any urinary symptoms. Pt states she want to the urgent care today and they told her there was blood in her urine. Pt states when she wiped she noted some brownish red discharge on her toilet paper and thinks it could be her period possibly. Pt reports n/v after eating.

## 2020-11-08 ENCOUNTER — Encounter (INDEPENDENT_AMBULATORY_CARE_PROVIDER_SITE_OTHER): Payer: 59 | Admitting: Ophthalmology

## 2020-11-13 ENCOUNTER — Encounter (INDEPENDENT_AMBULATORY_CARE_PROVIDER_SITE_OTHER): Payer: 59 | Admitting: Ophthalmology

## 2020-11-13 ENCOUNTER — Encounter (INDEPENDENT_AMBULATORY_CARE_PROVIDER_SITE_OTHER): Payer: Self-pay

## 2021-01-10 ENCOUNTER — Other Ambulatory Visit: Payer: Self-pay

## 2021-01-10 ENCOUNTER — Ambulatory Visit (INDEPENDENT_AMBULATORY_CARE_PROVIDER_SITE_OTHER): Payer: No Typology Code available for payment source | Admitting: Ophthalmology

## 2021-01-10 ENCOUNTER — Encounter (INDEPENDENT_AMBULATORY_CARE_PROVIDER_SITE_OTHER): Payer: Self-pay | Admitting: Ophthalmology

## 2021-01-10 DIAGNOSIS — E113512 Type 2 diabetes mellitus with proliferative diabetic retinopathy with macular edema, left eye: Secondary | ICD-10-CM

## 2021-01-10 DIAGNOSIS — E113411 Type 2 diabetes mellitus with severe nonproliferative diabetic retinopathy with macular edema, right eye: Secondary | ICD-10-CM | POA: Diagnosis not present

## 2021-01-10 MED ORDER — BEVACIZUMAB 2.5 MG/0.1ML IZ SOSY
2.5000 mg | PREFILLED_SYRINGE | INTRAVITREAL | Status: AC | PRN
  Administered 2021-01-10: 2.5 mg via INTRAVITREAL

## 2021-01-10 NOTE — Progress Notes (Signed)
01/10/2021     CHIEF COMPLAINT Patient presents for Retina Follow Up (3 Month F/U OU, poss Avastin OS//Pt denies noticeable changes to [place] OU since last visit. Pt denies ocular pain, flashes of light, or floaters OU. //A1c: 9. "something" 11/2020/LBS: 190 "something" 11/2020)   HISTORY OF PRESENT ILLNESS: Tiffany Mills is a 35 y.o. female who presents to the clinic today for:   HPI    Retina Follow Up    Patient presents with  Diabetic Retinopathy.  In left eye.  This started 3 months ago.  Severity is mild.  Duration of 3 months.  Since onset it is stable. Additional comments: 3 Month F/U OU, poss Avastin OS  Pt denies noticeable changes to [place] OU since last visit. Pt denies ocular pain, flashes of light, or floaters OU.   A1c: 9. "something" 11/2020 LBS: 190 "something" 11/2020       Last edited by Rockie Neighbours, [place] on 01/10/2021  8:09 AM. (History)      Referring physician: [name], NP [place],  [place] [id number]  HISTORICAL INFORMATION:   Selected notes from the [facility]: No current outpatient medications on file. (Ophthalmic Drugs)   No current facility-administered medications for this visit. (Ophthalmic Drugs)   Current Outpatient Medications (Other)  Medication Sig  . benazepril (LOTENSIN) 5 MG tablet Take 5 mg by mouth daily.  Marland Kitchen buPROPion (WELLBUTRIN XL) 300 MG 24 hr tablet Take 300 mg by mouth daily. Reported on 02/13/2016  . fenofibrate 160 MG tablet Take 160 mg by mouth daily. Reported on 02/13/2016  . gemfibrozil (LOPID) 600 MG tablet Take 600 mg by mouth 2 (two) times daily before a meal.  . metFORMIN (GLUCOPHAGE) 1000 MG tablet Take 1,000 mg by mouth 2 (two) times daily with a meal.  . norgestimate-ethinyl estradiol (SPRINTEC 28) 0.25-35 MG-MCG tablet Take 1 tablet by mouth daily. (Patient not taking: Reported on 02/13/2016)  . ondansetron (ZOFRAN ODT) 4 MG disintegrating tablet Take 1  tablet (4 mg total) by mouth every 8 (eight) hours as needed for nausea or vomiting.  . sitaGLIPtin (JANUVIA) 100 MG tablet Take 100 mg by mouth daily.  . tamsulosin (FLOMAX) 0.4 MG CAPS capsule Take 1 capsule (0.4 mg total) by mouth daily.   No current facility-administered medications for this visit. (Other)      REVIEW OF SYSTEMS:    ALLERGIES No Known Allergies  PAST MEDICAL HISTORY Past Medical History:  Diagnosis Date  . Depression   . Diabetes mellitus    TYPE II  . Hypercholesterolemia   . Serous cystadenofibroma    OF RIGHT OVARY,, WITH SCATTERED FOCI, SEROUS BODERLINE TUMOR.   Past Surgical History:  Procedure Laterality Date  . PELVIC LAPAROSCOPY     RIGHT OVARIAN CYSTECTOMY, PARTIAL OOPHORECTOMY    FAMILY HISTORY Family History  Problem Relation Age of Onset  . Diabetes Maternal Grandmother   . Hypertension Maternal Grandfather   . Cancer Maternal Grandfather        COLON, SKIN    SOCIAL HISTORY Social History   Tobacco Use  . Smoking status: Never Smoker  . Smokeless tobacco: Never Used  Substance Use Topics  . Alcohol use: Not Currently  . Drug use: No         OPHTHALMIC EXAM:  Base Eye Exam    Visual Acuity (ETDRS)      Right Left   Dist [place] 20/50  20/60   Dist ph [place] 20/25 +2 20/20 -2       Tonometry (Tonopen, 8:10 AM)      Right Left   Pressure 21 19       Pupils      Pupils Dark Light Shape React APD   Right PERRL 5 4 Round Brisk None   Left PERRL 5 4 Round Brisk None       Visual Fields (Counting fingers)      Left Right    Full Full       Extraocular Movement      Right Left    Full Full       Neuro/Psych    Oriented x3: Yes   Mood/Affect: Normal       Dilation    Both eyes: 1.0% Mydriacyl, 2.5% Phenylephrine @ 8:13 AM        Slit Lamp and Fundus Exam    External Exam      Right Left   External Normal Normal       Slit Lamp Exam      Right Left   Lids/Lashes Normal Normal   Conjunctiva/Sclera  White and quiet White and quiet   Cornea Clear Clear   Anterior Chamber Deep and quiet Deep and quiet   Iris Round and reactive Round and reactive   Lens Clear Clear   Anterior Vitreous Normal Normal       Fundus Exam      Right Left   Posterior Vitreous Normal Normal   Disc Normal Neovascularization >10-A, less active   C/D Ratio 0.25 0.25   Macula Macular thickening, Microaneurysms, Mild clinically significant macular edema Macular thickening, Microaneurysms, severe clinically significant macular edema   Vessels NPDR-Severe PDR-active   Periphery Normal Normal, good PRP anteriorly, superiorly, room nasally and inferiorly for more          IMAGING AND PROCEDURES  Imaging and Procedures for 01/10/21  OCT, Retina - OU - Both Eyes       Right Eye Quality was good. Scan locations included subfoveal. Central Foveal Thickness: 272. Progression has been stable.   Left Eye Quality was good. Scan locations included subfoveal. Central Foveal Thickness: 305. Progression has improved.   Notes Much less CSME OS, vitreomacular adhesion with no pathology OS.       Intravitreal Injection, Pharmacologic Agent - OS - Left Eye       Time Out 01/10/2021. 9:06 AM. Confirmed correct patient, procedure, site, and patient consented.   Anesthesia Topical anesthesia was used. Anesthetic medications included [place] 3.5%.   Procedure Preparation included Tobramycin 0.3%, 10% betadine to eyelids, 5% betadine to ocular surface. A 30 gauge needle was used.   Injection:  2.5 mg Bevacizumab (AVASTIN) 2.5mg /0.13mL SOSY   NDC: 57017-793-90, Lot: [id number]   Route: Intravitreal, Site: Left Eye  Post-op Post injection exam found visual acuity of at least counting fingers. The patient tolerated the procedure well. There were no complications. The patient received written and verbal post procedure care education. Post injection medications were not given.                  ASSESSMENT/PLAN:  Proliferative diabetic retinopathy of left eye with macular edema associated with type 2 diabetes mellitus (HCC) CSME OS has improved over the last 3 months.      ICD-10-CM   1. Proliferative diabetic retinopathy of left eye with macular edema associated with type 2 diabetes mellitus ([place])  Z00.9233 OCT, Retina - OU -  Both Eyes    Intravitreal Injection, Pharmacologic Agent - OS - Left Eye    bevacizumab (AVASTIN) SOSY 2.5 mg  2. Severe nonproliferative diabetic retinopathy of right eye, with macular edema, associated with type 2 diabetes mellitus (HCC)  E11.3411 OCT, Retina - OU - Both Eyes    1.  Early proliferative diabetic retinopathy left eye with clinically significant macular edema has improved since last visit some 3 months previous. Patient has sustained several medical conditions during this interim including kidney stones, Covid positivity and hospitalization treatment with high flow humidified oxygen, in  the unvaccinated state originally.  2.  Long discussion regarding the benefits of vaccinations so that she may spread to her friend circle and also her family as a discussion point was undertaken.  3.  CSME of the left eye has remained improved however as has less PDR by smaller NVD noted on clinical examination.  We will repeat injection today and plan for completion of PRP in the left eye in the coming weeks dilate  Dilate OU next visit and consider PRP left  Ophthalmic Meds Ordered this visit:  Meds ordered this encounter  Medications  . bevacizumab (AVASTIN) SOSY 2.5 mg       Return in about 6 weeks (around 02/21/2021) for DILATE OU, COLOR FP, OCT.  There are no Patient Instructions on file for this visit.   Explained the diagnoses, plan, and follow up with the patient and they expressed understanding.  Patient expressed understanding of the importance of proper follow up care.   Clent Demark Jermya Dowding M.D. Diseases & Surgery of the Retina and  Vitreous Retina & Diabetic [place] 01/10/21     Abbreviations: M myopia (nearsighted); A astigmatism; H hyperopia (farsighted); P presbyopia; Mrx spectacle prescription;  CTL contact lenses; OD right eye; OS left eye; OU both eyes  XT exotropia; ET esotropia; PEK punctate epithelial keratitis; PEE punctate epithelial erosions; DES dry eye syndrome; MGD meibomian gland dysfunction; ATs artificial tears; PFAT's preservative free artificial tears; [place] nuclear sclerotic cataract; PSC posterior subcapsular cataract; ERM epi-retinal membrane; PVD posterior vitreous detachment; RD retinal detachment; DM diabetes mellitus; DR diabetic retinopathy; NPDR non-proliferative diabetic retinopathy; PDR proliferative diabetic retinopathy; CSME clinically significant macular edema; DME diabetic macular edema; dbh dot blot hemorrhages; CWS cotton wool spot; POAG primary open angle glaucoma; C/D cup-to-disc ratio; HVF humphrey visual field; GVF goldmann visual field; OCT optical coherence tomography; IOP intraocular pressure; BRVO Branch retinal vein occlusion; CRVO central retinal vein occlusion; CRAO central retinal artery occlusion; BRAO branch retinal artery occlusion; RT retinal tear; SB scleral buckle; PPV pars plana vitrectomy; VH Vitreous hemorrhage; PRP panretinal laser photocoagulation; IVK intravitreal kenalog; VMT vitreomacular traction; MH Macular hole;  NVD neovascularization of the disc; NVE neovascularization elsewhere; AREDS age related eye disease study; ARMD age related macular degeneration; POAG primary open angle glaucoma; EBMD epithelial/anterior basement membrane dystrophy; ACIOL anterior chamber intraocular lens; IOL intraocular lens; PCIOL posterior chamber intraocular lens; Phaco/IOL phacoemulsification with intraocular lens placement; [place] photorefractive keratectomy; LASIK laser assisted in situ keratomileusis; HTN hypertension; DM diabetes mellitus; COPD chronic obstructive pulmonary disease

## 2021-01-10 NOTE — Assessment & Plan Note (Signed)
CSME OS has improved over the last 3 months.

## 2021-02-21 ENCOUNTER — Encounter (INDEPENDENT_AMBULATORY_CARE_PROVIDER_SITE_OTHER): Payer: Self-pay | Admitting: Ophthalmology

## 2021-02-21 ENCOUNTER — Ambulatory Visit (INDEPENDENT_AMBULATORY_CARE_PROVIDER_SITE_OTHER): Payer: No Typology Code available for payment source | Admitting: Ophthalmology

## 2021-02-21 ENCOUNTER — Other Ambulatory Visit: Payer: Self-pay

## 2021-02-21 DIAGNOSIS — E113411 Type 2 diabetes mellitus with severe nonproliferative diabetic retinopathy with macular edema, right eye: Secondary | ICD-10-CM | POA: Diagnosis not present

## 2021-02-21 DIAGNOSIS — E113512 Type 2 diabetes mellitus with proliferative diabetic retinopathy with macular edema, left eye: Secondary | ICD-10-CM

## 2021-02-21 NOTE — Assessment & Plan Note (Signed)
Status post focal laser, less CSME, stable at this time

## 2021-02-21 NOTE — Progress Notes (Signed)
02/21/2021     CHIEF COMPLAINT Patient presents for Retina Follow Up (6 Wk F/U OU//Pt denies noticeable changes to [place] OU since last visit. Pt denies ocular pain, flashes of light, or floaters OU. //LBS: has not been checking)   HISTORY OF PRESENT ILLNESS: Tiffany Mills is a 35 y.o. female who presents to the clinic today for:   HPI    Retina Follow Up    Patient presents with  Diabetic Retinopathy.  In both eyes.  This started 6 weeks ago.  Severity is mild.  Duration of 6 weeks.  Since onset it is stable. Additional comments: 6 Wk F/U OU  Pt denies noticeable changes to [place] OU since last visit. Pt denies ocular pain, flashes of light, or floaters OU.   LBS: has not been checking       Last edited by Rockie Neighbours, Duck on 02/21/2021  8:16 AM. (History)      Referring physician: [name], NP [place],  [place] [address]  HISTORICAL INFORMATION:   Selected notes from the [facility]: No current outpatient medications on file. (Ophthalmic Drugs)   No current facility-administered medications for this visit. (Ophthalmic Drugs)   Current Outpatient Medications (Other)  Medication Sig  . benazepril (LOTENSIN) 5 MG tablet Take 5 mg by mouth daily.  Marland Kitchen buPROPion (WELLBUTRIN XL) 300 MG 24 hr tablet Take 300 mg by mouth daily. Reported on 02/13/2016  . fenofibrate 160 MG tablet Take 160 mg by mouth daily. Reported on 02/13/2016  . gemfibrozil (LOPID) 600 MG tablet Take 600 mg by mouth 2 (two) times daily before a meal.  . metFORMIN (GLUCOPHAGE) 1000 MG tablet Take 1,000 mg by mouth 2 (two) times daily with a meal.  . norgestimate-ethinyl estradiol (SPRINTEC 28) 0.25-35 MG-MCG tablet Take 1 tablet by mouth daily. (Patient not taking: Reported on 02/13/2016)  . ondansetron (ZOFRAN ODT) 4 MG disintegrating tablet Take 1 tablet (4 mg total) by mouth every 8 (eight) hours as needed for nausea or vomiting.  . sitaGLIPtin  (JANUVIA) 100 MG tablet Take 100 mg by mouth daily.  . tamsulosin (FLOMAX) 0.4 MG CAPS capsule Take 1 capsule (0.4 mg total) by mouth daily.   No current facility-administered medications for this visit. (Other)      REVIEW OF SYSTEMS:    ALLERGIES No Known Allergies  PAST MEDICAL HISTORY Past Medical History:  Diagnosis Date  . Depression   . Diabetes mellitus    TYPE II  . Hypercholesterolemia   . Serous cystadenofibroma    OF RIGHT OVARY,, WITH SCATTERED FOCI, SEROUS BODERLINE TUMOR.   Past Surgical History:  Procedure Laterality Date  . PELVIC LAPAROSCOPY     RIGHT OVARIAN CYSTECTOMY, PARTIAL OOPHORECTOMY    FAMILY HISTORY Family History  Problem Relation Age of Onset  . Diabetes Maternal Grandmother   . Hypertension Maternal Grandfather   . Cancer Maternal Grandfather        COLON, SKIN    SOCIAL HISTORY Social History   Tobacco Use  . Smoking status: Never Smoker  . Smokeless tobacco: Never Used  Substance Use Topics  . Alcohol use: Not Currently  . Drug use: No         OPHTHALMIC EXAM:  Base Eye Exam    Visual Acuity (ETDRS)      Right Left   Dist [place] 20/30 20/40   Dist ph [place] 20/20 -2 20/20 -2  Tonometry (Tonopen, 8:16 AM)      Right Left   Pressure 17 20       Pupils      Pupils Dark Light Shape React APD   Right PERRL 6 5 Round Brisk None   Left PERRL 6 5 Round Brisk None       Visual Fields (Counting fingers)      Left Right    Full Full       Extraocular Movement      Right Left    Full Full       Neuro/Psych    Oriented x3: Yes   Mood/Affect: Normal       Dilation    Both eyes: 1.0% Mydriacyl, 2.5% Phenylephrine @ 8:19 AM        Slit Lamp and Fundus Exam    External Exam      Right Left   External Normal Normal       Slit Lamp Exam      Right Left   Lids/Lashes Normal Normal   Conjunctiva/Sclera White and quiet White and quiet   Cornea Clear Clear   Anterior Chamber Deep and quiet Deep and quiet    Iris Round and reactive Round and reactive   Lens Clear Clear   Anterior Vitreous Normal Normal       Fundus Exam      Right Left   Posterior Vitreous Normal Normal   Disc Normal Neovascularization <10-A, less active   C/D Ratio 0.25 0.25   Macula Macular thickening, Microaneurysms, Mild clinically significant macular edema less Macular thickening, Microaneurysms, severe clinically significant macular edema   Vessels NPDR-Severe PDR-active   Periphery Normal Normal, good PRP anteriorly, superiorly,,,,, room nasally and inferiorly for more          IMAGING AND PROCEDURES  Imaging and Procedures for 02/21/21  OCT, Retina - OU - Both Eyes       Right Eye Quality was good. Scan locations included subfoveal. Central Foveal Thickness: 274. Progression has been stable. Findings include normal foveal contour.   Left Eye Quality was good. Scan locations included subfoveal. Central Foveal Thickness: 301. Progression has improved. Findings include vitreomacular adhesion .   Notes Much less CSME OS, vitreomacular adhesion with no pathology OS.         Color Fundus Photography Optos - OU - Both Eyes       Right Eye Progression has improved. Disc findings include normal observations. Macula : microaneurysms.   Left Eye Progression has improved. Disc findings include neovascularization. Macula : microaneurysms.   Notes OD with severe NPDR and no progression to PDR on clinical examination nor documented on color fundus photography.  OS with much smaller NVE ,  NVD post injection Avastin and post PRP #1.  Room for PRP exist temporally and inferiorly and nasally                ASSESSMENT/PLAN:  Proliferative diabetic retinopathy of left eye with macular edema associated with type 2 diabetes mellitus ([place]) PDR with HRC improved postinjection and post PRP #1 OS  Needs completion of PRP #2 nasally and inferiorly left eye in the near future  Severe nonproliferative  diabetic retinopathy of right eye, with macular edema, associated with type 2 diabetes mellitus ([place]) Status post focal laser, less CSME, stable at this time      ICD-10-CM   1. Severe nonproliferative diabetic retinopathy of right eye, with macular edema, associated with type 2 diabetes mellitus ([place])  A57.9038 OCT, Retina - OU - Both Eyes    Color Fundus Photography Optos - OU - Both Eyes  2. Proliferative diabetic retinopathy of left eye with macular edema associated with type 2 diabetes mellitus (HCC)  B33.8329 OCT, Retina - OU - Both Eyes    Color Fundus Photography Optos - OU - Both Eyes    1.  OU with much improved status of diabetic retinopathy.  OD with no progression beyond severe NPDR.  OS, with PDR much less active post injection Avastin and post PRP #1 will need completion PRP to the inferior and nasal retina in the near future  2.  3.  Ophthalmic Meds Ordered this visit:  No orders of the defined types were placed in this encounter.      Return in about 1 week (around 02/28/2021) for dilate, OS, PRP.  There are no Patient Instructions on file for this visit.   Explained the diagnoses, plan, and follow up with the patient and they expressed understanding.  Patient expressed understanding of the importance of proper follow up care.   Clent Demark Rankin M.D. Diseases & Surgery of the Retina and Vitreous Retina & Diabetic [place] 02/21/21     Abbreviations: M myopia (nearsighted); A astigmatism; H hyperopia (farsighted); P presbyopia; Mrx spectacle prescription;  CTL contact lenses; OD right eye; OS left eye; OU both eyes  XT exotropia; ET esotropia; PEK punctate epithelial keratitis; PEE punctate epithelial erosions; DES dry eye syndrome; MGD meibomian gland dysfunction; ATs artificial tears; PFAT's preservative free artificial tears; [place] nuclear sclerotic cataract; PSC posterior subcapsular cataract; ERM epi-retinal membrane; PVD posterior vitreous detachment; RD  retinal detachment; DM diabetes mellitus; DR diabetic retinopathy; NPDR non-proliferative diabetic retinopathy; PDR proliferative diabetic retinopathy; CSME clinically significant macular edema; DME diabetic macular edema; dbh dot blot hemorrhages; CWS cotton wool spot; POAG primary open angle glaucoma; C/D cup-to-disc ratio; HVF humphrey visual field; GVF goldmann visual field; OCT optical coherence tomography; IOP intraocular pressure; BRVO Branch retinal vein occlusion; CRVO central retinal vein occlusion; CRAO central retinal artery occlusion; BRAO branch retinal artery occlusion; RT retinal tear; SB scleral buckle; PPV pars plana vitrectomy; VH Vitreous hemorrhage; PRP panretinal laser photocoagulation; IVK intravitreal kenalog; VMT vitreomacular traction; MH Macular hole;  NVD neovascularization of the disc; NVE neovascularization elsewhere; AREDS age related eye disease study; ARMD age related macular degeneration; POAG primary open angle glaucoma; EBMD epithelial/anterior basement membrane dystrophy; ACIOL anterior chamber intraocular lens; IOL intraocular lens; PCIOL posterior chamber intraocular lens; Phaco/IOL phacoemulsification with intraocular lens placement; [place] photorefractive keratectomy; LASIK laser assisted in situ keratomileusis; HTN hypertension; DM diabetes mellitus; COPD chronic obstructive pulmonary disease

## 2021-02-21 NOTE — Assessment & Plan Note (Signed)
PDR with [place] improved postinjection and post PRP #1 OS  Needs completion of PRP #2 nasally and inferiorly left eye in the near future

## 2021-02-28 ENCOUNTER — Ambulatory Visit (INDEPENDENT_AMBULATORY_CARE_PROVIDER_SITE_OTHER): Payer: No Typology Code available for payment source | Admitting: Ophthalmology

## 2021-02-28 ENCOUNTER — Other Ambulatory Visit: Payer: Self-pay

## 2021-02-28 ENCOUNTER — Encounter (INDEPENDENT_AMBULATORY_CARE_PROVIDER_SITE_OTHER): Payer: Self-pay | Admitting: Ophthalmology

## 2021-02-28 DIAGNOSIS — E113512 Type 2 diabetes mellitus with proliferative diabetic retinopathy with macular edema, left eye: Secondary | ICD-10-CM

## 2021-02-28 NOTE — Progress Notes (Signed)
02/28/2021     CHIEF COMPLAINT Patient presents for Retina Follow Up (PRP OS/Pt denies changes or issues since last visit./BGL: did not check)   HISTORY OF PRESENT ILLNESS: Tiffany Mills is a 35 y.o. female who presents to the clinic today for:   HPI    Retina Follow Up    Patient presents with  Diabetic Retinopathy.  In left eye.  Severity is moderate.  Duration of 1 week.  Since onset it is stable.  I, the attending physician,  performed the HPI with the patient and updated documentation appropriately. Additional comments: PRP OS Pt denies changes or issues since last visit. BGL: did not check       Last edited by [name] on 02/28/2021  8:51 AM. (History)      Referring physician: [name], NP [place],  [place] [phone]  HISTORICAL INFORMATION:   Selected notes from the MEDICAL RECORD NUMBER       CURRENT MEDICATIONS: No current outpatient medications on file. (Ophthalmic Drugs)   No current facility-administered medications for this visit. (Ophthalmic Drugs)   Current Outpatient Medications (Other)  Medication Sig  . benazepril (LOTENSIN) 5 MG tablet Take 5 mg by mouth daily.  Marland Kitchen buPROPion (WELLBUTRIN XL) 300 MG 24 hr tablet Take 300 mg by mouth daily. Reported on 02/13/2016  . fenofibrate 160 MG tablet Take 160 mg by mouth daily. Reported on 02/13/2016  . gemfibrozil (LOPID) 600 MG tablet Take 600 mg by mouth 2 (two) times daily before a meal.  . metFORMIN (GLUCOPHAGE) 1000 MG tablet Take 1,000 mg by mouth 2 (two) times daily with a meal.  . norgestimate-ethinyl estradiol (SPRINTEC 28) 0.25-35 MG-MCG tablet Take 1 tablet by mouth daily. (Patient not taking: Reported on 02/13/2016)  . ondansetron (ZOFRAN ODT) 4 MG disintegrating tablet Take 1 tablet (4 mg total) by mouth every 8 (eight) hours as needed for nausea or vomiting.  . sitaGLIPtin (JANUVIA) 100 MG tablet Take 100 mg by mouth daily.  . tamsulosin (FLOMAX) 0.4 MG CAPS  capsule Take 1 capsule (0.4 mg total) by mouth daily.   No current facility-administered medications for this visit. (Other)      REVIEW OF SYSTEMS: ROS    Positive for: Endocrine   Last edited by [name] on 02/28/2021  8:51 AM. (History)       ALLERGIES No Known Allergies  PAST MEDICAL HISTORY Past Medical History:  Diagnosis Date  . Depression   . Diabetes mellitus    TYPE II  . Hypercholesterolemia   . Serous cystadenofibroma    OF RIGHT OVARY,, WITH SCATTERED FOCI, SEROUS BODERLINE TUMOR.   Past Surgical History:  Procedure Laterality Date  . PELVIC LAPAROSCOPY     RIGHT OVARIAN CYSTECTOMY, PARTIAL OOPHORECTOMY    FAMILY HISTORY Family History  Problem Relation Age of Onset  . Diabetes Maternal Grandmother   . Hypertension Maternal Grandfather   . Cancer Maternal Grandfather        COLON, SKIN    SOCIAL HISTORY Social History   Tobacco Use  . Smoking status: Never Smoker  . Smokeless tobacco: Never Used  Substance Use Topics  . Alcohol use: Not Currently  . Drug use: No         OPHTHALMIC EXAM: Base Eye Exam    Visual Acuity (Snellen - Linear)      Right Left   Dist [place] 20/40 20/50   Dist ph [place] 20/20 -1 20/25 -2  Tonometry (Tonopen, 8:54 AM)      Right Left   Pressure 18 15       Neuro/Psych    Oriented x3: Yes   Mood/Affect: Normal       Dilation    Left eye: 1.0% Mydriacyl, 2.5% Phenylephrine @ 8:54 AM        Slit Lamp and Fundus Exam    External Exam      Right Left   External Normal Normal       Slit Lamp Exam      Right Left   Lids/Lashes Normal Normal   Conjunctiva/Sclera White and quiet White and quiet   Cornea Clear Clear   Anterior Chamber Deep and quiet Deep and quiet   Iris Round and reactive Round and reactive   Lens Clear Clear   Anterior Vitreous Normal Normal          IMAGING AND PROCEDURES  Imaging and Procedures for 02/28/21  Panretinal Photocoagulation - OS - Left Eye        Time Out Confirmed correct patient, procedure, site, and patient consented.   Anesthesia Topical anesthesia was used. Anesthetic medications included Proparacaine 0.5%.   Laser Information The type of laser was diode. Color was yellow. The duration in seconds was 0.03. The spot size was 390 microns. Laser power was 300. Total spots was 374.   Post-op The patient tolerated the procedure well. There were no complications. The patient received written and verbal post procedure care education.   Notes PRP applied inferonasal and inferior retina                ASSESSMENT/PLAN:  No problem-specific Assessment & Plan notes found for this encounter.      ICD-10-CM   1. Proliferative diabetic retinopathy of left eye with macular edema associated with type 2 diabetes mellitus (HCC)  Y77.4128 Panretinal Photocoagulation - OS - Left Eye    1.  PRP #2 completed today without difficulty inferior and inferonasal retina 2.  Left eye OU approximately 3 to 4 months  3.  Ophthalmic Meds Ordered this visit:  No orders of the defined types were placed in this encounter.      Return in about 14 weeks (around 06/06/2021) for DILATE OU, COLOR FP, OCT.  There are no Patient Instructions on file for this visit.   Explained the diagnoses, plan, and follow up with the patient and they expressed understanding.  Patient expressed understanding of the importance of proper follow up care.   Clent Demark Novaleigh Kohlman M.D. Diseases & Surgery of the Retina and Vitreous Retina & Diabetic [place] 02/28/21     Abbreviations: M myopia (nearsighted); A astigmatism; H hyperopia (farsighted); P presbyopia; Mrx spectacle prescription;  CTL contact lenses; OD right eye; OS left eye; OU both eyes  XT exotropia; ET esotropia; PEK punctate epithelial keratitis; PEE punctate epithelial erosions; DES dry eye syndrome; MGD meibomian gland dysfunction; ATs artificial tears; PFAT's preservative free artificial  tears; [place] nuclear sclerotic cataract; PSC posterior subcapsular cataract; ERM epi-retinal membrane; PVD posterior vitreous detachment; RD retinal detachment; DM diabetes mellitus; DR diabetic retinopathy; NPDR non-proliferative diabetic retinopathy; PDR proliferative diabetic retinopathy; CSME clinically significant macular edema; DME diabetic macular edema; dbh dot blot hemorrhages; CWS cotton wool spot; POAG primary open angle glaucoma; C/D cup-to-disc ratio; HVF humphrey visual field; GVF goldmann visual field; OCT optical coherence tomography; IOP intraocular pressure; BRVO Branch retinal vein occlusion; CRVO central retinal vein occlusion; CRAO central retinal artery occlusion; BRAO branch retinal  artery occlusion; RT retinal tear; SB scleral buckle; PPV pars plana vitrectomy; VH Vitreous hemorrhage; PRP panretinal laser photocoagulation; IVK intravitreal kenalog; VMT vitreomacular traction; MH Macular hole;  NVD neovascularization of the disc; NVE neovascularization elsewhere; AREDS age related eye disease study; ARMD age related macular degeneration; POAG primary open angle glaucoma; EBMD epithelial/anterior basement membrane dystrophy; ACIOL anterior chamber intraocular lens; IOL intraocular lens; PCIOL posterior chamber intraocular lens; Phaco/IOL phacoemulsification with intraocular lens placement; [place] photorefractive keratectomy; LASIK laser assisted in situ keratomileusis; HTN hypertension; DM diabetes mellitus; COPD chronic obstructive pulmonary disease

## 2021-06-06 ENCOUNTER — Encounter (INDEPENDENT_AMBULATORY_CARE_PROVIDER_SITE_OTHER): Payer: Self-pay | Admitting: Ophthalmology

## 2021-06-06 ENCOUNTER — Ambulatory Visit (INDEPENDENT_AMBULATORY_CARE_PROVIDER_SITE_OTHER): Payer: No Typology Code available for payment source | Admitting: Ophthalmology

## 2021-06-06 ENCOUNTER — Other Ambulatory Visit: Payer: Self-pay

## 2021-06-06 DIAGNOSIS — E113411 Type 2 diabetes mellitus with severe nonproliferative diabetic retinopathy with macular edema, right eye: Secondary | ICD-10-CM | POA: Diagnosis not present

## 2021-06-06 DIAGNOSIS — E113512 Type 2 diabetes mellitus with proliferative diabetic retinopathy with macular edema, left eye: Secondary | ICD-10-CM

## 2021-06-06 NOTE — Assessment & Plan Note (Signed)
Much less macular edema in fact at 14 weeks post PRP #2, no recurrence of CSME OS despite elevated blood sugar.  Thus recent 2 sessions of PRP have successfully decrease vegF load.  There is room for further laser inferotemporal and superonasal peripherally OS

## 2021-06-06 NOTE — Assessment & Plan Note (Signed)
No residual macular edema by OCT OD today yet signs of very severe NPDR with high risk of progression to PDR given patient's ongoing difficulties controlling systemic blood sugar with an A1c elevated now at 11.  Will deliver peripheral anterior PRP in the coming weeks with fluorescein angiography guidance

## 2021-06-06 NOTE — Progress Notes (Signed)
06/06/2021     CHIEF COMPLAINT Patient presents for Retina Follow Up (14 week fu OU and OCT/FP/Pt states VA OU stable since last visit. Pt denies FOL, floaters, or ocular pain OU. Karie Mainland: 11.0/LBS: does not check)   HISTORY OF PRESENT ILLNESS: Tiffany Mills is a 35 y.o. female who presents to the clinic today for:   HPI     Retina Follow Up           Diagnosis: Diabetic Retinopathy   Laterality: both eyes   Onset: 14 weeks ago   Severity: mild   Duration: 14 weeks   Course: stable   Comments: 14 week fu OU and OCT/FP Pt states VA OU stable since last visit. Pt denies FOL, floaters, or ocular pain OU.  A1C: 11.0 LBS: does not check       Last edited by Kendra Opitz, COA on 06/06/2021  8:16 AM.      Referring physician: [name], NP [place],  [place] [phone]  HISTORICAL INFORMATION:   Selected notes from the [facility]: No current outpatient medications on file. (Ophthalmic Drugs)   No current facility-administered medications for this visit. (Ophthalmic Drugs)   Current Outpatient Medications (Other)  Medication Sig   benazepril (LOTENSIN) 5 MG tablet Take 5 mg by mouth daily.   buPROPion (WELLBUTRIN XL) 300 MG 24 hr tablet Take 300 mg by mouth daily. Reported on 02/13/2016   fenofibrate 160 MG tablet Take 160 mg by mouth daily. Reported on 02/13/2016   gemfibrozil (LOPID) 600 MG tablet Take 600 mg by mouth 2 (two) times daily before a meal.   metFORMIN (GLUCOPHAGE) 1000 MG tablet Take 1,000 mg by mouth 2 (two) times daily with a meal.   norgestimate-ethinyl estradiol (SPRINTEC 28) 0.25-35 MG-MCG tablet Take 1 tablet by mouth daily. (Patient not taking: Reported on 02/13/2016)   ondansetron (ZOFRAN ODT) 4 MG disintegrating tablet Take 1 tablet (4 mg total) by mouth every 8 (eight) hours as needed for nausea or vomiting.   sitaGLIPtin (JANUVIA) 100 MG tablet Take 100 mg by mouth daily.    tamsulosin (FLOMAX) 0.4 MG CAPS capsule Take 1 capsule (0.4 mg total) by mouth daily.   No current facility-administered medications for this visit. (Other)      REVIEW OF SYSTEMS:    ALLERGIES No Known Allergies  PAST MEDICAL HISTORY Past Medical History:  Diagnosis Date   Depression    Diabetes mellitus    TYPE II   Hypercholesterolemia    Serous cystadenofibroma    OF RIGHT OVARY,, WITH SCATTERED FOCI, SEROUS BODERLINE TUMOR.   Past Surgical History:  Procedure Laterality Date   PELVIC LAPAROSCOPY     RIGHT OVARIAN CYSTECTOMY, PARTIAL OOPHORECTOMY    FAMILY HISTORY Family History  Problem Relation Age of Onset   Diabetes Maternal Grandmother    Hypertension Maternal Grandfather    Cancer Maternal Grandfather        COLON, SKIN    SOCIAL HISTORY Social History   Tobacco Use   Smoking status: Never   Smokeless tobacco: Never  Substance Use Topics   Alcohol use: Not Currently   Drug use: No         OPHTHALMIC EXAM:  Base Eye Exam     Visual Acuity (ETDRS)       Right Left   Dist [place] 20/80 20/60   Dist ph [place] 20/25 20/40  Tonometry (Tonopen, 8:21 AM)       Right Left   Pressure 13 14         Pupils       Pupils Dark Light Shape React APD   Right PERRL 6 5 Round Brisk None   Left PERRL 6 5 Round Brisk None         Visual Fields (Counting fingers)       Left Right    Full Full         Extraocular Movement       Right Left    Full Full         Neuro/Psych     Oriented x3: Yes   Mood/Affect: Normal         Dilation     Both eyes: 1.0% Mydriacyl, 2.5% Phenylephrine @ 8:21 AM           Slit Lamp and Fundus Exam     External Exam       Right Left   External Normal Normal         Slit Lamp Exam       Right Left   Lids/Lashes Normal Normal   Conjunctiva/Sclera White and quiet White and quiet   Cornea Clear Clear   Anterior Chamber Deep and quiet Deep and quiet   Iris Round and reactive  Round and reactive   Lens Clear Clear   Anterior Vitreous Normal Normal         Fundus Exam       Right Left   Posterior Vitreous Normal Normal   Disc Normal Neovascularization <10-A, less active   C/D Ratio 0.25 0.25   Macula Macular thickening, Microaneurysms, Mild clinically significant macular edema less Macular thickening, Microaneurysms, severe clinically significant macular edema   Vessels NPDR-Severe PDR-active   Periphery Normal Normal, good PRP anteriorly, superiorly,,,,, room nasally and inferiorly for more            IMAGING AND PROCEDURES  Imaging and Procedures for 06/06/21  OCT, Retina - OU - Both Eyes       Right Eye Quality was good. Scan locations included subfoveal. Central Foveal Thickness: 285. Progression has been stable. Findings include abnormal foveal contour.   Left Eye Quality was good. Scan locations included subfoveal. Central Foveal Thickness: 316. Progression has improved. Findings include vitreomacular adhesion , abnormal foveal contour.   Notes Much less CSME OS, vitreomacular adhesion with no pathology OS.  OD with multifocal diffuse thickening though no overt CSME suspicious for early recurrence because of the diffuse thickening increase       Color Fundus Photography Optos - OU - Both Eyes       Right Eye Progression has improved. Disc findings include normal observations. Macula : microaneurysms.   Left Eye Progression has improved. Macula : microaneurysms.   Notes OD with severe NPDR and no progression to PDR on clinical examination nor documented on color fundus photography.  OS with much smaller NVE ,  NVD post injection Avastin and post PRP #2.  Room for PRP exist inferotemporal and superonasal             ASSESSMENT/PLAN:  Severe nonproliferative diabetic retinopathy of right eye, with macular edema, associated with type 2 diabetes mellitus (HCC) No residual macular edema by OCT OD today yet signs of very  severe NPDR with high risk of progression to PDR given patient's ongoing difficulties controlling systemic blood sugar with an A1c elevated now at 11.  Will deliver peripheral anterior PRP in the coming weeks with fluorescein angiography guidance  Proliferative diabetic retinopathy of left eye with macular edema associated with type 2 diabetes mellitus (HCC) Much less macular edema in fact at 14 weeks post PRP #2, no recurrence of CSME OS despite elevated blood sugar.  Thus recent 2 sessions of PRP have successfully decrease vegF load.  There is room for further laser inferotemporal and superonasal peripherally OS     ICD-10-CM   1. Proliferative diabetic retinopathy of left eye with macular edema associated with type 2 diabetes mellitus (HCC)  D32.2025 OCT, Retina - OU - Both Eyes    Color Fundus Photography Optos - OU - Both Eyes    2. Severe nonproliferative diabetic retinopathy of right eye, with macular edema, associated with type 2 diabetes mellitus (HCC)  E11.3411 OCT, Retina - OU - Both Eyes    Color Fundus Photography Optos - OU - Both Eyes      1.  OD, with severe NPDR confirmed by fluorescein angiography September 2021.  With high risk for developing PDR due to ongoing difficulties with blood sugar control and A1c of 11.  Will need fluorescein angiography OU next in order to delineate the areas requiring therapy and treatment with peripheral anterior PRP to the areas of retinal nonperfusion OD  2.  OS, CSME has cleared nicely.  Has been no recurrence although diffuse thickening has slightly recurred suggesting some minor progression but no antivegF required at this time.  Status post PRP #2 with room inferotemporal and superonasal for completion of PRP in the future  3.  Dilate OU next and fluorescein angiography left right to study the macula and the peripheral retinal perfusion with wide angle Optos angiography  Ophthalmic Meds Ordered this visit:  No orders of the defined types  were placed in this encounter.      Return in about 5 weeks (around 07/11/2021) for DILATE OU, COLOR FP, OPTOS FFA L/R, PRP, OD.  There are no Patient Instructions on file for this visit.   Explained the diagnoses, plan, and follow up with the patient and they expressed understanding.  Patient expressed understanding of the importance of proper follow up care.   Clent Demark Jessi Jessop M.D. Diseases & Surgery of the Retina and Vitreous Retina & Diabetic [place] 06/06/21     Abbreviations: M myopia (nearsighted); A astigmatism; H hyperopia (farsighted); P presbyopia; Mrx spectacle prescription;  CTL contact lenses; OD right eye; OS left eye; OU both eyes  XT exotropia; ET esotropia; PEK punctate epithelial keratitis; PEE punctate epithelial erosions; DES dry eye syndrome; MGD meibomian gland dysfunction; ATs artificial tears; PFAT's preservative free artificial tears; [place] nuclear sclerotic cataract; PSC posterior subcapsular cataract; ERM epi-retinal membrane; PVD posterior vitreous detachment; RD retinal detachment; DM diabetes mellitus; DR diabetic retinopathy; NPDR non-proliferative diabetic retinopathy; PDR proliferative diabetic retinopathy; CSME clinically significant macular edema; DME diabetic macular edema; dbh dot blot hemorrhages; CWS cotton wool spot; POAG primary open angle glaucoma; C/D cup-to-disc ratio; HVF humphrey visual field; GVF goldmann visual field; OCT optical coherence tomography; IOP intraocular pressure; BRVO Branch retinal vein occlusion; CRVO central retinal vein occlusion; CRAO central retinal artery occlusion; BRAO branch retinal artery occlusion; RT retinal tear; SB scleral buckle; PPV pars plana vitrectomy; VH Vitreous hemorrhage; PRP panretinal laser photocoagulation; IVK intravitreal kenalog; VMT vitreomacular traction; MH Macular hole;  NVD neovascularization of the disc; NVE neovascularization elsewhere; AREDS age related eye disease study; ARMD age related macular  degeneration; POAG primary open  angle glaucoma; EBMD epithelial/anterior basement membrane dystrophy; ACIOL anterior chamber intraocular lens; IOL intraocular lens; PCIOL posterior chamber intraocular lens; Phaco/IOL phacoemulsification with intraocular lens placement; [place] photorefractive keratectomy; LASIK laser assisted in situ keratomileusis; HTN hypertension; DM diabetes mellitus; COPD chronic obstructive pulmonary disease

## 2021-07-12 ENCOUNTER — Encounter (INDEPENDENT_AMBULATORY_CARE_PROVIDER_SITE_OTHER): Payer: No Typology Code available for payment source | Admitting: Ophthalmology

## 2021-08-22 ENCOUNTER — Encounter (INDEPENDENT_AMBULATORY_CARE_PROVIDER_SITE_OTHER): Payer: 59 | Admitting: Ophthalmology

## 2021-09-12 ENCOUNTER — Ambulatory Visit (INDEPENDENT_AMBULATORY_CARE_PROVIDER_SITE_OTHER): Payer: No Typology Code available for payment source | Admitting: Ophthalmology

## 2021-09-12 ENCOUNTER — Other Ambulatory Visit: Payer: Self-pay

## 2021-09-12 ENCOUNTER — Encounter (INDEPENDENT_AMBULATORY_CARE_PROVIDER_SITE_OTHER): Payer: Self-pay | Admitting: Ophthalmology

## 2021-09-12 DIAGNOSIS — E113411 Type 2 diabetes mellitus with severe nonproliferative diabetic retinopathy with macular edema, right eye: Secondary | ICD-10-CM

## 2021-09-12 DIAGNOSIS — E113512 Type 2 diabetes mellitus with proliferative diabetic retinopathy with macular edema, left eye: Secondary | ICD-10-CM | POA: Diagnosis not present

## 2021-09-12 MED ORDER — FLUORESCEIN SODIUM 10 % IV SOLN
500.0000 mg | INTRAVENOUS | Status: AC | PRN
  Administered 2021-09-12: 500 mg via INTRAVENOUS

## 2021-09-12 NOTE — Assessment & Plan Note (Signed)
Very severe nonproliferative diabetic retinopathy is noted 360 OD.  We will also need peripheral PRP to halt progression to PDR OD

## 2021-09-12 NOTE — Progress Notes (Signed)
09/12/2021     CHIEF COMPLAINT Patient presents for  Chief Complaint  Patient presents with   Retina Follow Up      HISTORY OF PRESENT ILLNESS: Tiffany Mills is a 35 y.o. female who presents to the clinic today for:   HPI     Retina Follow Up   Patient presents with  Diabetic Retinopathy.  In both eyes.  This started 3 months ago.  Severity is mild.  Duration of 3 months.  Since onset it is stable.        Comments   3 month fu fp/ffa l/r oct prp OD Pt states VA OU stable since last visit. Pt denies FOL, floaters, or ocular pain OU.  A1C:Just had drawn today does not know results yet LBS: 132      Last edited by Kendra Opitz, COA on 09/12/2021 10:59 AM.      Referring physician: [name], NP [place],  [place] [phone]  HISTORICAL INFORMATION:   Selected notes from the MEDICAL RECORD NUMBER       CURRENT MEDICATIONS: No current outpatient medications on file. (Ophthalmic Drugs)   No current facility-administered medications for this visit. (Ophthalmic Drugs)   Current Outpatient Medications (Other)  Medication Sig   benazepril (LOTENSIN) 5 MG tablet Take 5 mg by mouth daily.   buPROPion (WELLBUTRIN XL) 300 MG 24 hr tablet Take 300 mg by mouth daily. Reported on 02/13/2016   fenofibrate 160 MG tablet Take 160 mg by mouth daily. Reported on 02/13/2016   gemfibrozil (LOPID) 600 MG tablet Take 600 mg by mouth 2 (two) times daily before a meal.   metFORMIN (GLUCOPHAGE) 1000 MG tablet Take 1,000 mg by mouth 2 (two) times daily with a meal.   norgestimate-ethinyl estradiol (SPRINTEC 28) 0.25-35 MG-MCG tablet Take 1 tablet by mouth daily. (Patient not taking: Reported on 02/13/2016)   ondansetron (ZOFRAN ODT) 4 MG disintegrating tablet Take 1 tablet (4 mg total) by mouth every 8 (eight) hours as needed for nausea or vomiting.   sitaGLIPtin (JANUVIA) 100 MG tablet Take 100 mg by mouth daily.   tamsulosin (FLOMAX) 0.4 MG CAPS  capsule Take 1 capsule (0.4 mg total) by mouth daily.   No current facility-administered medications for this visit. (Other)      REVIEW OF SYSTEMS:    ALLERGIES No Known Allergies  PAST MEDICAL HISTORY Past Medical History:  Diagnosis Date   Depression    Diabetes mellitus    TYPE II   Hypercholesterolemia    Serous cystadenofibroma    OF RIGHT OVARY,, WITH SCATTERED FOCI, SEROUS BODERLINE TUMOR.   Past Surgical History:  Procedure Laterality Date   PELVIC LAPAROSCOPY     RIGHT OVARIAN CYSTECTOMY, PARTIAL OOPHORECTOMY    FAMILY HISTORY Family History  Problem Relation Age of Onset   Diabetes Maternal Grandmother    Hypertension Maternal Grandfather    Cancer Maternal Grandfather        COLON, SKIN    SOCIAL HISTORY Social History   Tobacco Use   Smoking status: Never   Smokeless tobacco: Never  Substance Use Topics   Alcohol use: Not Currently   Drug use: No         OPHTHALMIC EXAM:  Base Eye Exam     Visual Acuity (ETDRS)       Right Left   Dist cc 20/25 -1 20/30   Dist ph cc  NI  Tonometry (Tonopen, 11:04 AM)       Right Left   Pressure 15 13         Pupils       Pupils Dark Light Shape React APD   Right PERRL 6 5 Round Brisk None   Left PERRL 6 5 Round Brisk None         Visual Fields (Counting fingers)       Left Right    Full Full         Extraocular Movement       Right Left    Full Full         Neuro/Psych     Oriented x3: Yes   Mood/Affect: Normal         Dilation     Both eyes: 1.0% Mydriacyl, 2.5% Phenylephrine @ 11:04 AM           Slit Lamp and Fundus Exam     External Exam       Right Left   External Normal Normal         Slit Lamp Exam       Right Left   Lids/Lashes Normal Normal   Conjunctiva/Sclera White and quiet White and quiet   Cornea Clear Clear   Anterior Chamber Deep and quiet Deep and quiet   Iris Round and reactive Round and reactive   Lens Clear Clear    Anterior Vitreous Normal Normal         Fundus Exam       Right Left   Posterior Vitreous Normal Normal   Disc Normal Neovascularization <10-A, less active   C/D Ratio 0.25 0.25   Macula Macular thickening, Microaneurysms, Mild clinically significant macular edema less Macular thickening, Microaneurysms, severe clinically significant macular edema   Vessels NPDR-Severe PDR-active   Periphery Normal Normal, good PRP anteriorly, superiorly,,,,, room nasally and inferiorly for more            IMAGING AND PROCEDURES  Imaging and Procedures for 09/12/21  Color Fundus Photography Optos - OU - Both Eyes       Right Eye Progression has improved. Disc findings include normal observations. Macula : microaneurysms.   Left Eye Progression has improved. Disc findings include neovascularization. Macula : microaneurysms.   Notes OD with severe NPDR and no progression to PDR on clinical examination nor documented on color fundus photography. Now with active progression of PDR with neovascularization of the disc OS     Fluorescein Angiography Optos (Transit OS)       Injection: 500 mg Fluorescein Sodium 10 %   Route: Intravenous   NDC: [phone]   Right Eye Mid/Late phase findings include leakage, microaneurysm, vascular perfusion defect. Choroidal neovascularization is not present.   Left Eye   Early phase findings include leakage, microaneurysm, vascular perfusion defect. Mid/Late phase findings include retinal neovascularization, vascular perfusion defect. Choroidal neovascularization is not present.   Notes OD with severe very severe nonproliferative diabetic retinopathy and extensive peripheral retinal nonperfusion most of it anterior to the equator by angiography wide-field.  OS with good PRP nasal and inferior and superotemporal.  Still with neovascularization of the nerve, greater than standard photo 10-C , active with areas of untreated retina inferotemporal  and superiorly.  Will need PRP in these area     Panretinal Photocoagulation - OD - Right Eye       Time Out Confirmed correct patient, procedure, site, and patient consented.   Anesthesia Topical anesthesia was used.  Anesthetic medications included Proparacaine 0.5%.   Laser Information The type of laser was diode. Color was yellow. The duration in seconds was 0.03. The spot size was 390 microns. Laser power was 230. Total spots was 894.   Post-op The patient tolerated the procedure well. There were no complications. The patient received written and verbal post procedure care education.   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  We will also need peripheral PRP to halt progression to PDR OD     ICD-10-CM   1. Proliferative diabetic retinopathy of left eye with macular edema associated with type 2 diabetes mellitus (HCC)  D66.4403 Color Fundus Photography Optos - OU - Both Eyes    Fluorescein Angiography Optos (Transit OS)    Fluorescein Sodium 10 % injection 500 mg    CANCELED: OCT, Retina - OU - Both Eyes    2. Severe nonproliferative diabetic retinopathy of right eye, with macular edema, associated with type 2 diabetes mellitus (HCC)  K74.2595 Color Fundus Photography Optos - OU - Both Eyes    Fluorescein Angiography Optos (Transit OS)    Panretinal Photocoagulation - OD - Right Eye    Fluorescein Sodium 10 % injection 500 mg    CANCELED: OCT, Retina - OU - Both Eyes       1.  OD with confirm severe NPDR with high risk for PDR progression.  PRP delivered anteriorly today  2.  OS, PRP will be required to complete inferotemporal as well as super nasally.  Vascularization of the inner  3.  Ophthalmic Meds Ordered this visit:  Meds ordered this encounter  Medications   Fluorescein Sodium 10 % injection 500 mg       Return in about 2 weeks (around 09/26/2021) for dilate, OS, PRP.  There are no Patient Instructions on file for this visit.   Explained the diagnoses, plan, and follow up with the patient and they expressed understanding.  Patient expressed understanding of the importance of proper follow up care.   Clent Demark Voncille Simm M.D. Diseases & Surgery of the Retina and Vitreous Retina & Diabetic [place] 09/12/21     Abbreviations: M myopia (nearsighted); A astigmatism; H hyperopia (farsighted); P presbyopia; Mrx spectacle prescription;  CTL contact lenses; OD right eye; OS left eye; OU both eyes  XT exotropia; ET esotropia; PEK punctate epithelial keratitis; PEE punctate epithelial erosions; DES dry eye syndrome; MGD meibomian gland dysfunction; ATs artificial tears; PFAT's preservative free artificial tears; [place] nuclear sclerotic cataract; PSC posterior subcapsular cataract; ERM epi-retinal membrane; PVD posterior vitreous detachment; RD retinal detachment; DM diabetes mellitus; DR diabetic retinopathy; NPDR non-proliferative diabetic retinopathy; PDR proliferative diabetic retinopathy; CSME clinically significant macular edema; DME diabetic macular edema; dbh dot blot hemorrhages; CWS cotton wool spot; POAG primary open angle glaucoma; C/D cup-to-disc ratio; HVF humphrey visual field; GVF goldmann visual field; OCT optical coherence tomography; IOP intraocular pressure; BRVO Branch retinal vein occlusion; CRVO central retinal vein occlusion; CRAO central retinal artery occlusion; BRAO branch retinal artery occlusion; RT retinal  tear; SB scleral  buckle; PPV pars plana vitrectomy; VH Vitreous hemorrhage; PRP panretinal laser photocoagulation; IVK intravitreal kenalog; VMT vitreomacular traction; MH Macular hole;  NVD neovascularization of the disc; NVE neovascularization elsewhere; AREDS age related eye disease study; ARMD age related macular degeneration; POAG primary open angle glaucoma; EBMD epithelial/anterior basement membrane dystrophy; ACIOL anterior chamber intraocular lens; IOL intraocular lens; PCIOL posterior chamber intraocular lens; Phaco/IOL phacoemulsification with intraocular lens placement; [place] photorefractive keratectomy; LASIK laser assisted in situ keratomileusis; HTN hypertension; DM diabetes mellitus; COPD chronic obstructive pulmonary disease

## 2021-09-12 NOTE — Assessment & Plan Note (Signed)
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

## 2021-09-26 ENCOUNTER — Encounter (INDEPENDENT_AMBULATORY_CARE_PROVIDER_SITE_OTHER): Payer: Self-pay | Admitting: Ophthalmology

## 2021-09-26 ENCOUNTER — Other Ambulatory Visit: Payer: Self-pay

## 2021-09-26 ENCOUNTER — Ambulatory Visit (INDEPENDENT_AMBULATORY_CARE_PROVIDER_SITE_OTHER): Payer: No Typology Code available for payment source | Admitting: Ophthalmology

## 2021-09-26 DIAGNOSIS — E113411 Type 2 diabetes mellitus with severe nonproliferative diabetic retinopathy with macular edema, right eye: Secondary | ICD-10-CM

## 2021-09-26 DIAGNOSIS — E113512 Type 2 diabetes mellitus with proliferative diabetic retinopathy with macular edema, left eye: Secondary | ICD-10-CM

## 2021-09-26 NOTE — Progress Notes (Signed)
09/26/2021     CHIEF COMPLAINT Patient presents for  Chief Complaint  Patient presents with   Retina Follow Up      HISTORY OF PRESENT ILLNESS: Tiffany Mills is a 35 y.o. female who presents to the clinic today for:   HPI     Retina Follow Up   Patient presents with  Diabetic Retinopathy.  In both eyes.  This started 2 weeks ago.  Severity is mild.  Duration of 2 weeks.  Since onset it is stable.        Comments   2 week prp OS oct. Patient states vision is stable and unchanged since last visit. Denies any new floaters or FOL.       Last edited by Laurin Coder on 09/26/2021  8:05 AM.      Referring physician: [name], NP [place],   [phone]  HISTORICAL INFORMATION:   Selected notes from the [facility]: No current outpatient medications on file. (Ophthalmic Drugs)   No current facility-administered medications for this visit. (Ophthalmic Drugs)   Current Outpatient Medications (Other)  Medication Sig   benazepril (LOTENSIN) 5 MG tablet Take 5 mg by mouth daily.   buPROPion (WELLBUTRIN XL) 300 MG 24 hr tablet Take 300 mg by mouth daily. Reported on 02/13/2016   fenofibrate 160 MG tablet Take 160 mg by mouth daily. Reported on 02/13/2016   gemfibrozil (LOPID) 600 MG tablet Take 600 mg by mouth 2 (two) times daily before a meal.   metFORMIN (GLUCOPHAGE) 1000 MG tablet Take 1,000 mg by mouth 2 (two) times daily with a meal.   norgestimate-ethinyl estradiol (SPRINTEC 28) 0.25-35 MG-MCG tablet Take 1 tablet by mouth daily. (Patient not taking: Reported on 02/13/2016)   ondansetron (ZOFRAN ODT) 4 MG disintegrating tablet Take 1 tablet (4 mg total) by mouth every 8 (eight) hours as needed for nausea or vomiting.   sitaGLIPtin (JANUVIA) 100 MG tablet Take 100 mg by mouth daily.   tamsulosin (FLOMAX) 0.4 MG CAPS capsule Take 1 capsule (0.4 mg total) by mouth daily.   No current  facility-administered medications for this visit. (Other)      REVIEW OF SYSTEMS:    ALLERGIES No Known Allergies  PAST MEDICAL HISTORY Past Medical History:  Diagnosis Date   Depression    Diabetes mellitus    TYPE II   Hypercholesterolemia    Serous cystadenofibroma    OF RIGHT OVARY,, WITH SCATTERED FOCI, SEROUS BODERLINE TUMOR.   Past Surgical History:  Procedure Laterality Date   PELVIC LAPAROSCOPY     RIGHT OVARIAN CYSTECTOMY, PARTIAL OOPHORECTOMY    FAMILY HISTORY Family History  Problem Relation Age of Onset   Diabetes Maternal Grandmother    Hypertension Maternal Grandfather    Cancer Maternal Grandfather        COLON, SKIN    SOCIAL HISTORY Social History   Tobacco Use   Smoking status: Never   Smokeless tobacco: Never  Substance Use Topics   Alcohol use: Not Currently   Drug use: No         OPHTHALMIC EXAM:  Base Eye Exam     Visual Acuity (ETDRS)       Right Left   Dist [place] 20/40 20/40   Dist ph [place] 20/30 -2 20/25         Tonometry (Tonopen, 8:07 AM)       Right Left  Pressure 11 12         Pupils       Pupils Dark Light Shape APD   Right PERRL 5 4 Round None   Left PERRL 5 4 Round None         Extraocular Movement       Right Left    Full Full         Neuro/Psych     Oriented x3: Yes   Mood/Affect: Normal         Dilation     Left eye: 1.0% Mydriacyl, 2.5% Phenylephrine @ 8:07 AM           Slit Lamp and Fundus Exam     External Exam       Right Left   External Normal Normal         Slit Lamp Exam       Right Left   Lids/Lashes Normal Normal   Conjunctiva/Sclera White and quiet White and quiet   Cornea Clear Clear   Anterior Chamber Deep and quiet Deep and quiet   Iris Round and reactive Round and reactive   Lens Clear Clear   Anterior Vitreous Normal Normal         Fundus Exam       Right Left   Posterior Vitreous  Normal   Disc  Neovascularization <10-A, less active    C/D Ratio  0.25   Macula  less Macular thickening, Microaneurysms, severe clinically significant macular edema   Vessels  PDR-active   Periphery  Normal, good PRP anteriorly, superiorly,,,,, room nasally and inferiorly for more            IMAGING AND PROCEDURES  Imaging and Procedures for 09/26/21  OCT, Retina - OU - Both Eyes       Right Eye Quality was good. Scan locations included subfoveal. Central Foveal Thickness: 303. Progression has been stable. Findings include abnormal foveal contour.   Left Eye Quality was good. Scan locations included subfoveal. Central Foveal Thickness: 318. Progression has improved. Findings include vitreomacular adhesion , abnormal foveal contour.   Notes Much less CSME OS, vitreomacular adhesion with no pathology OS.  OD with multifocal diffuse thickening though no overt CSME suspicious for early recurrence because of the diffuse thickening increase       Panretinal Photocoagulation - OS - Left Eye       Time Out Confirmed correct patient, procedure, site, and patient consented.   Anesthesia Topical anesthesia was used. Anesthetic medications included Proparacaine 0.5%.   Laser Information The type of laser was diode. Color was yellow. The duration in seconds was 0.03. The spot size was 390 microns. Laser power was 280. Total spots was 1284.   Post-op The patient tolerated the procedure well. There were no complications. The patient received written and verbal post procedure care education.   Notes FFA guided PRP to regions of nonperfusion and no prior treatment, inferotemporal as well as superonasal left eye             ASSESSMENT/PLAN:  Proliferative diabetic retinopathy of left eye with macular edema associated with type 2 diabetes mellitus ([place]) Progressive PDR with NVD, verified September 12, 2021 by FFA.  Large region of untreated retina, retinal nonperfusion inferotemporal and superonasal to be treated today with  PRP  Severe nonproliferative diabetic retinopathy of right eye, with macular edema, associated with type 2 diabetes mellitus ([place]) OD with large regions of retinal nonperfusion nasal as well as temporal.  We will schedule peripheral anterior PRP to prevent progression to PDR     ICD-10-CM   1. Proliferative diabetic retinopathy of left eye with macular edema associated with type 2 diabetes mellitus (HCC)  T02.4097 OCT, Retina - OU - Both Eyes    Panretinal Photocoagulation - OS - Left Eye    2. Severe nonproliferative diabetic retinopathy of right eye, with macular edema, associated with type 2 diabetes mellitus ([place])  E11.3411       1.  PRP added and completed OS to diminish the growth of progressive PDR and NVD OS document 09-12-2021 FFA  2.  The with severe NPDR will need peripheral anterior PRP to prevent progression to PDR  3.  Ophthalmic Meds Ordered this visit:  No orders of the defined types were placed in this encounter.      Return in about 2 weeks (around 10/10/2021) for dilate, OD, PRP ,,, NOV 14, or NOV 15.  There are no Patient Instructions on file for this visit.   Explained the diagnoses, plan, and follow up with the patient and they expressed understanding.  Patient expressed understanding of the importance of proper follow up care.   Clent Demark Willam Munford M.D. Diseases & Surgery of the Retina and Vitreous Retina & Diabetic [place] 09/26/21     Abbreviations: M myopia (nearsighted); A astigmatism; H hyperopia (farsighted); P presbyopia; Mrx spectacle prescription;  CTL contact lenses; OD right eye; OS left eye; OU both eyes  XT exotropia; ET esotropia; PEK punctate epithelial keratitis; PEE punctate epithelial erosions; DES dry eye syndrome; MGD meibomian gland dysfunction; ATs artificial tears; PFAT's preservative free artificial tears; [place] nuclear sclerotic cataract; PSC posterior subcapsular cataract; ERM epi-retinal membrane; PVD posterior vitreous detachment;  RD retinal detachment; DM diabetes mellitus; DR diabetic retinopathy; NPDR non-proliferative diabetic retinopathy; PDR proliferative diabetic retinopathy; CSME clinically significant macular edema; DME diabetic macular edema; dbh dot blot hemorrhages; CWS cotton wool spot; POAG primary open angle glaucoma; C/D cup-to-disc ratio; HVF humphrey visual field; GVF goldmann visual field; OCT optical coherence tomography; IOP intraocular pressure; BRVO Branch retinal vein occlusion; CRVO central retinal vein occlusion; CRAO central retinal artery occlusion; BRAO branch retinal artery occlusion; RT retinal tear; SB scleral buckle; PPV pars plana vitrectomy; VH Vitreous hemorrhage; PRP panretinal laser photocoagulation; IVK intravitreal kenalog; VMT vitreomacular traction; MH Macular hole;  NVD neovascularization of the disc; NVE neovascularization elsewhere; AREDS age related eye disease study; ARMD age related macular degeneration; POAG primary open angle glaucoma; EBMD epithelial/anterior basement membrane dystrophy; ACIOL anterior chamber intraocular lens; IOL intraocular lens; PCIOL posterior chamber intraocular lens; Phaco/IOL phacoemulsification with intraocular lens placement; [place] photorefractive keratectomy; LASIK laser assisted in situ keratomileusis; HTN hypertension; DM diabetes mellitus; COPD chronic obstructive pulmonary disease

## 2021-09-26 NOTE — Assessment & Plan Note (Signed)
OD with large regions of retinal nonperfusion nasal as well as temporal.  We will schedule peripheral anterior PRP to prevent progression to PDR

## 2021-09-26 NOTE — Assessment & Plan Note (Signed)
Progressive PDR with NVD, verified September 12, 2021 by FFA.  Large region of untreated retina, retinal nonperfusion inferotemporal and superonasal to be treated today with PRP

## 2021-10-10 ENCOUNTER — Encounter (INDEPENDENT_AMBULATORY_CARE_PROVIDER_SITE_OTHER): Payer: No Typology Code available for payment source | Admitting: Ophthalmology

## 2021-10-15 ENCOUNTER — Ambulatory Visit (INDEPENDENT_AMBULATORY_CARE_PROVIDER_SITE_OTHER): Payer: No Typology Code available for payment source | Admitting: Ophthalmology

## 2021-10-15 ENCOUNTER — Other Ambulatory Visit: Payer: Self-pay

## 2021-10-15 ENCOUNTER — Encounter (INDEPENDENT_AMBULATORY_CARE_PROVIDER_SITE_OTHER): Payer: Self-pay | Admitting: Ophthalmology

## 2021-10-15 DIAGNOSIS — E113411 Type 2 diabetes mellitus with severe nonproliferative diabetic retinopathy with macular edema, right eye: Secondary | ICD-10-CM

## 2021-10-15 NOTE — Progress Notes (Signed)
10/15/2021     CHIEF COMPLAINT Patient presents for  Chief Complaint  Patient presents with   Retina Follow Up      HISTORY OF PRESENT ILLNESS: Tiffany Mills is a 35 y.o. female who presents to the clinic today for:   HPI     Retina Follow Up   Patient presents with  Diabetic Retinopathy.  In right eye.  This started 2 weeks ago.  Duration of 2 weeks.  Since onset it is stable.        Comments   2 week PRP OD      Last edited by Reather Littler, COA on 10/15/2021  8:18 AM.      Referring physician: [name], NP [place],  [place] [id number]  HISTORICAL INFORMATION:   Selected notes from the MEDICAL RECORD NUMBER       CURRENT MEDICATIONS: No current outpatient medications on file. (Ophthalmic Drugs)   No current facility-administered medications for this visit. (Ophthalmic Drugs)   Current Outpatient Medications (Other)  Medication Sig   benazepril (LOTENSIN) 5 MG tablet Take 5 mg by mouth daily.   buPROPion (WELLBUTRIN XL) 300 MG 24 hr tablet Take 300 mg by mouth daily. Reported on 02/13/2016   fenofibrate 160 MG tablet Take 160 mg by mouth daily. Reported on 02/13/2016   gemfibrozil (LOPID) 600 MG tablet Take 600 mg by mouth 2 (two) times daily before a meal.   metFORMIN (GLUCOPHAGE) 1000 MG tablet Take 1,000 mg by mouth 2 (two) times daily with a meal.   norgestimate-ethinyl estradiol (SPRINTEC 28) 0.25-35 MG-MCG tablet Take 1 tablet by mouth daily. (Patient not taking: Reported on 02/13/2016)   ondansetron (ZOFRAN ODT) 4 MG disintegrating tablet Take 1 tablet (4 mg total) by mouth every 8 (eight) hours as needed for nausea or vomiting.   sitaGLIPtin (JANUVIA) 100 MG tablet Take 100 mg by mouth daily.   tamsulosin (FLOMAX) 0.4 MG CAPS capsule Take 1 capsule (0.4 mg total) by mouth daily.   No current facility-administered medications for this visit. (Other)      REVIEW OF SYSTEMS:    ALLERGIES No Known  Allergies  PAST MEDICAL HISTORY Past Medical History:  Diagnosis Date   Depression    Diabetes mellitus    TYPE II   Hypercholesterolemia    Serous cystadenofibroma    OF RIGHT OVARY,, WITH SCATTERED FOCI, SEROUS BODERLINE TUMOR.   Past Surgical History:  Procedure Laterality Date   PELVIC LAPAROSCOPY     RIGHT OVARIAN CYSTECTOMY, PARTIAL OOPHORECTOMY    FAMILY HISTORY Family History  Problem Relation Age of Onset   Diabetes Maternal Grandmother    Hypertension Maternal Grandfather    Cancer Maternal Grandfather        COLON, SKIN    SOCIAL HISTORY Social History   Tobacco Use   Smoking status: Never   Smokeless tobacco: Never  Substance Use Topics   Alcohol use: Not Currently   Drug use: No         OPHTHALMIC EXAM:  Base Eye Exam     Visual Acuity (ETDRS)       Right Left   Dist cc 20/32 20/32   Dist ph cc NI 20/25    Correction: Glasses         Tonometry (Tonopen, 8:23 AM)       Right Left   Pressure 12 12         Pupils  Dark Light Shape React APD   Right 5 4 Irregular Brisk None   Left 5 4.5 Round Brisk None         Visual Fields (Counting fingers)       Left Right    Full Full         Extraocular Movement       Right Left    Full, Ortho Full, Ortho         Neuro/Psych     Oriented x3: Yes   Mood/Affect: Normal         Dilation     Right eye: 1.0% Mydriacyl, 2.5% Phenylephrine @ 8:23 AM           Slit Lamp and Fundus Exam     External Exam       Right Left   External Normal Normal         Slit Lamp Exam       Right Left   Lids/Lashes Normal Normal   Conjunctiva/Sclera White and quiet White and quiet   Cornea Clear Clear   Anterior Chamber Deep and quiet Deep and quiet   Iris Round and reactive Round and reactive   Lens Clear Clear   Anterior Vitreous Normal Normal         Fundus Exam       Right Left   Posterior Vitreous Normal    Disc Normal    C/D Ratio 0.25    Macula  Macular thickening, Microaneurysms, Mild clinically significant macular edema    Vessels NPDR-Severe ,very    Periphery Normal             IMAGING AND PROCEDURES  Imaging and Procedures for 10/15/21  Panretinal Photocoagulation - OD - Right Eye       Time Out Confirmed correct patient, procedure, site, and patient consented.   Anesthesia Topical anesthesia was used. Anesthetic medications included Proparacaine 0.5%.   Laser Information The type of laser was diode. Color was yellow. The duration in seconds was 0.03. The spot size was 390 microns. Laser power was 260. Total spots was 761.   Post-op The patient tolerated the procedure well. There were no complications. The patient received written and verbal post procedure care education.   Notes Prp applied superior, and temporally             ASSESSMENT/PLAN:  Severe nonproliferative diabetic retinopathy of right eye, with macular edema, associated with type 2 diabetes mellitus (HCC) OD, with very severe NPDR retinal nonperfusion anteriorly.  Complete PRP superior temporal today.  History of prior PRP nasal inferiorly.      ICD-10-CM   1. Severe nonproliferative diabetic retinopathy of right eye, with macular edema, associated with type 2 diabetes mellitus (HCC)  P54.6568 Panretinal Photocoagulation - OD - Right Eye      1.  PRP OD completed today.  No complications.  Patient informed she is released to do full activity later today as the dilation of the right eye wears off  2.  Dilate OU next and with particular attention to the left eye to monitor that the NVD which was large, is resolving  3.  Ophthalmic Meds Ordered this visit:  No orders of the defined types were placed in this encounter.      Return in about 3 weeks (around 11/05/2021) for DILATE OU, COLOR FP, OCT.  There are no Patient Instructions on file for this visit.   Explained the diagnoses, plan, and follow up with the  patient and they  expressed understanding.  Patient expressed understanding of the importance of proper follow up care.   Clent Demark Tayen Narang M.D. Diseases & Surgery of the Retina and Vitreous Retina & Diabetic [place] 10/15/21     Abbreviations: M myopia (nearsighted); A astigmatism; H hyperopia (farsighted); P presbyopia; Mrx spectacle prescription;  CTL contact lenses; OD right eye; OS left eye; OU both eyes  XT exotropia; ET esotropia; PEK punctate epithelial keratitis; PEE punctate epithelial erosions; DES dry eye syndrome; MGD meibomian gland dysfunction; ATs artificial tears; PFAT's preservative free artificial tears; [place] nuclear sclerotic cataract; PSC posterior subcapsular cataract; ERM epi-retinal membrane; PVD posterior vitreous detachment; RD retinal detachment; DM diabetes mellitus; DR diabetic retinopathy; NPDR non-proliferative diabetic retinopathy; PDR proliferative diabetic retinopathy; CSME clinically significant macular edema; DME diabetic macular edema; dbh dot blot hemorrhages; CWS cotton wool spot; POAG primary open angle glaucoma; C/D cup-to-disc ratio; HVF humphrey visual field; GVF goldmann visual field; OCT optical coherence tomography; IOP intraocular pressure; BRVO Branch retinal vein occlusion; CRVO central retinal vein occlusion; CRAO central retinal artery occlusion; BRAO branch retinal artery occlusion; RT retinal tear; SB scleral buckle; PPV pars plana vitrectomy; VH Vitreous hemorrhage; PRP panretinal laser photocoagulation; IVK intravitreal kenalog; VMT vitreomacular traction; MH Macular hole;  NVD neovascularization of the disc; NVE neovascularization elsewhere; AREDS age related eye disease study; ARMD age related macular degeneration; POAG primary open angle glaucoma; EBMD epithelial/anterior basement membrane dystrophy; ACIOL anterior chamber intraocular lens; IOL intraocular lens; PCIOL posterior chamber intraocular lens; Phaco/IOL phacoemulsification with intraocular lens placement;  [place] photorefractive keratectomy; LASIK laser assisted in situ keratomileusis; HTN hypertension; DM diabetes mellitus; COPD chronic obstructive pulmonary disease

## 2021-10-15 NOTE — Assessment & Plan Note (Signed)
OD, with very severe NPDR retinal nonperfusion anteriorly.  Complete PRP superior temporal today.  History of prior PRP nasal inferiorly.

## 2021-11-05 ENCOUNTER — Encounter (INDEPENDENT_AMBULATORY_CARE_PROVIDER_SITE_OTHER): Payer: Self-pay | Admitting: Ophthalmology

## 2021-11-05 ENCOUNTER — Other Ambulatory Visit: Payer: Self-pay

## 2021-11-05 ENCOUNTER — Ambulatory Visit (INDEPENDENT_AMBULATORY_CARE_PROVIDER_SITE_OTHER): Payer: No Typology Code available for payment source | Admitting: Ophthalmology

## 2021-11-05 DIAGNOSIS — E113411 Type 2 diabetes mellitus with severe nonproliferative diabetic retinopathy with macular edema, right eye: Secondary | ICD-10-CM | POA: Diagnosis not present

## 2021-11-05 DIAGNOSIS — E113512 Type 2 diabetes mellitus with proliferative diabetic retinopathy with macular edema, left eye: Secondary | ICD-10-CM

## 2021-11-05 NOTE — Progress Notes (Signed)
11/05/2021     CHIEF COMPLAINT Patient presents for  Chief Complaint  Patient presents with   Post-op Follow-up      HISTORY OF PRESENT ILLNESS: Tiffany Mills is a 35 y.o. female who presents to the clinic today for:   HPI     Post-op Follow-up   In both eyes.        Comments   Status post recent PRP OU for progressive proliferative diabetic retinopathy left eye and very severe nonproliferative diabetic retinopathy right eye.    No interval change in acuity      Last edited by Hurman Horn, MD on 11/05/2021  8:23 AM.      Referring physician: [name], NP [place],  [place] [phone]  HISTORICAL INFORMATION:   Selected notes from the MEDICAL RECORD NUMBER       CURRENT MEDICATIONS: No current outpatient medications on file. (Ophthalmic Drugs)   No current facility-administered medications for this visit. (Ophthalmic Drugs)   Current Outpatient Medications (Other)  Medication Sig   benazepril (LOTENSIN) 5 MG tablet Take 5 mg by mouth daily.   buPROPion (WELLBUTRIN XL) 300 MG 24 hr tablet Take 300 mg by mouth daily. Reported on 02/13/2016   fenofibrate 160 MG tablet Take 160 mg by mouth daily. Reported on 02/13/2016   gemfibrozil (LOPID) 600 MG tablet Take 600 mg by mouth 2 (two) times daily before a meal.   metFORMIN (GLUCOPHAGE) 1000 MG tablet Take 1,000 mg by mouth 2 (two) times daily with a meal.   norgestimate-ethinyl estradiol (SPRINTEC 28) 0.25-35 MG-MCG tablet Take 1 tablet by mouth daily. (Patient not taking: Reported on 02/13/2016)   ondansetron (ZOFRAN ODT) 4 MG disintegrating tablet Take 1 tablet (4 mg total) by mouth every 8 (eight) hours as needed for nausea or vomiting.   sitaGLIPtin (JANUVIA) 100 MG tablet Take 100 mg by mouth daily.   tamsulosin (FLOMAX) 0.4 MG CAPS capsule Take 1 capsule (0.4 mg total) by mouth daily.   No current facility-administered medications for this visit. (Other)      REVIEW OF  SYSTEMS:    ALLERGIES No Known Allergies  PAST MEDICAL HISTORY Past Medical History:  Diagnosis Date   Depression    Diabetes mellitus    TYPE II   Hypercholesterolemia    Serous cystadenofibroma    OF RIGHT OVARY,, WITH SCATTERED FOCI, SEROUS BODERLINE TUMOR.   Past Surgical History:  Procedure Laterality Date   PELVIC LAPAROSCOPY     RIGHT OVARIAN CYSTECTOMY, PARTIAL OOPHORECTOMY    FAMILY HISTORY Family History  Problem Relation Age of Onset   Diabetes Maternal Grandmother    Hypertension Maternal Grandfather    Cancer Maternal Grandfather        COLON, SKIN    SOCIAL HISTORY Social History   Tobacco Use   Smoking status: Never   Smokeless tobacco: Never  Substance Use Topics   Alcohol use: Not Currently   Drug use: No         OPHTHALMIC EXAM:  Base Eye Exam     Visual Acuity (ETDRS)       Right Left   Dist cc 20/25 20/25 +2   Dist ph cc NI          Tonometry (Tonopen, 8:09 AM)       Right Left   Pressure 14 14         Neuro/Psych     Oriented x3: Yes   Mood/Affect: Normal  Slit Lamp and Fundus Exam     External Exam       Right Left   External Normal Normal         Slit Lamp Exam       Right Left   Lids/Lashes Normal Normal   Conjunctiva/Sclera White and quiet White and quiet   Cornea Clear Clear   Anterior Chamber Deep and quiet Deep and quiet   Iris Round and reactive Round and reactive   Lens Clear Clear   Anterior Vitreous Normal Normal         Fundus Exam       Right Left   Posterior Vitreous Normal Normal   Disc Normal Neovascularization 10-c size, flat, stable since October   C/D Ratio 0.25 0.25   Macula Macular thickening, Microaneurysms, Mild clinically significant macular edema Macular thickening, Microaneurysms, Mild clinically significant macular edema   Vessels NPDR-Severe, NPDR-Severe, very   Periphery Good peripheral anterior PRP Good PRP nearly 360, some room superiorly for  completion            IMAGING AND PROCEDURES  Imaging and Procedures for 11/05/21  Color Fundus Photography Optos - OU - Both Eyes       Right Eye Progression has improved. Disc findings include normal observations. Macula : microaneurysms.   Left Eye Progression has improved. Disc findings include neovascularization. Macula : microaneurysms.   Notes OD with severe NPDR and no progression to PDR on clinical examination nor documented on color fundus photography. Stable yet active  of PDR with neovascularization of the disc OS, approximately 10 -C standard photo size, flat not elevated. Room for additional PRP completion superior               ASSESSMENT/PLAN:  Proliferative diabetic retinopathy of left eye with macular edema associated with type 2 diabetes mellitus ([place]) No signs of active macular edema left eye.  Good peripheral PRP, as compared to October 2022.  We will continue to observe OS at this time     ICD-10-CM   1. Proliferative diabetic retinopathy of left eye with macular edema associated with type 2 diabetes mellitus (HCC)  B71.6967 Color Fundus Photography Optos - OU - Both Eyes    2. Severe nonproliferative diabetic retinopathy of right eye, with macular edema, associated with type 2 diabetes mellitus (HCC)  E93.8101 Color Fundus Photography Optos - OU - Both Eyes      1.  OD now protected from progression to PDR with good peripheral anterior PRP, no signs of PDR.  Will observe  2.  OS active PDR, controlled with additional PRP, will continue to monitor and follow the NVI of the nerve follow-up in 2 months  3.  Ophthalmic Meds Ordered this visit:  No orders of the defined types were placed in this encounter.      Return in about 9 weeks (around 01/07/2022) for DILATE OU, COLOR FP, OCT.  There are no Patient Instructions on file for this visit.   Explained the diagnoses, plan, and follow up with the patient and they expressed understanding.   Patient expressed understanding of the importance of proper follow up care.   Clent Demark Daimion Adamcik M.D. Diseases & Surgery of the Retina and Vitreous Retina & Diabetic [place] 11/05/21     Abbreviations: M myopia (nearsighted); A astigmatism; H hyperopia (farsighted); P presbyopia; Mrx spectacle prescription;  CTL contact lenses; OD right eye; OS left eye; OU both eyes  XT exotropia; ET esotropia; PEK punctate epithelial  keratitis; PEE punctate epithelial erosions; DES dry eye syndrome; MGD meibomian gland dysfunction; ATs artificial tears; PFAT's preservative free artificial tears; [place] nuclear sclerotic cataract; PSC posterior subcapsular cataract; ERM epi-retinal membrane; PVD posterior vitreous detachment; RD retinal detachment; DM diabetes mellitus; DR diabetic retinopathy; NPDR non-proliferative diabetic retinopathy; PDR proliferative diabetic retinopathy; CSME clinically significant macular edema; DME diabetic macular edema; dbh dot blot hemorrhages; CWS cotton wool spot; POAG primary open angle glaucoma; C/D cup-to-disc ratio; HVF humphrey visual field; GVF goldmann visual field; OCT optical coherence tomography; IOP intraocular pressure; BRVO Branch retinal vein occlusion; CRVO central retinal vein occlusion; CRAO central retinal artery occlusion; BRAO branch retinal artery occlusion; RT retinal tear; SB scleral buckle; PPV pars plana vitrectomy; VH Vitreous hemorrhage; PRP panretinal laser photocoagulation; IVK intravitreal kenalog; VMT vitreomacular traction; MH Macular hole;  NVD neovascularization of the disc; NVE neovascularization elsewhere; AREDS age related eye disease study; ARMD age related macular degeneration; POAG primary open angle glaucoma; EBMD epithelial/anterior basement membrane dystrophy; ACIOL anterior chamber intraocular lens; IOL intraocular lens; PCIOL posterior chamber intraocular lens; Phaco/IOL phacoemulsification with intraocular lens placement; [place] photorefractive  keratectomy; LASIK laser assisted in situ keratomileusis; HTN hypertension; DM diabetes mellitus; COPD chronic obstructive pulmonary disease

## 2021-11-05 NOTE — Assessment & Plan Note (Signed)
No signs of active macular edema left eye.  Good peripheral PRP, as compared to October 2022.  We will continue to observe OS at this time

## 2022-01-07 ENCOUNTER — Ambulatory Visit (INDEPENDENT_AMBULATORY_CARE_PROVIDER_SITE_OTHER): Payer: No Typology Code available for payment source | Admitting: Ophthalmology

## 2022-01-07 ENCOUNTER — Encounter (INDEPENDENT_AMBULATORY_CARE_PROVIDER_SITE_OTHER): Payer: Self-pay | Admitting: Ophthalmology

## 2022-01-07 ENCOUNTER — Other Ambulatory Visit: Payer: Self-pay

## 2022-01-07 DIAGNOSIS — E113411 Type 2 diabetes mellitus with severe nonproliferative diabetic retinopathy with macular edema, right eye: Secondary | ICD-10-CM

## 2022-01-07 DIAGNOSIS — H43822 Vitreomacular adhesion, left eye: Secondary | ICD-10-CM | POA: Diagnosis not present

## 2022-01-07 DIAGNOSIS — E113512 Type 2 diabetes mellitus with proliferative diabetic retinopathy with macular edema, left eye: Secondary | ICD-10-CM | POA: Diagnosis not present

## 2022-01-07 NOTE — Assessment & Plan Note (Signed)
Much improved OS still with active neovascularization of the nerve none elevated, will likely need posterior PRP completed in the future

## 2022-01-07 NOTE — Progress Notes (Signed)
01/07/2022     CHIEF COMPLAINT Patient presents for  Chief Complaint  Patient presents with   Retina Follow Up      HISTORY OF PRESENT ILLNESS: Tiffany Mills is a 36 y.o. female who presents to the clinic today for:   HPI     Retina Follow Up           Diagnosis: Diabetic Retinopathy   Laterality: left eye   Onset: 9 weeks ago   Severity: mild   Duration: 9 weeks   Course: stable         Comments   9 weeks dilate OU, color fp, oct. Patient states vision is stable and unchanged since last visit. Denies any new floaters or FOL. Pt states current BS as of right now, fasting, is 120.      Last edited by Laurin Coder on 01/07/2022  8:07 AM.      Referring physician: [name], NP [place],  [place] [phone]  HISTORICAL INFORMATION:   Selected notes from the [facility]: No current outpatient medications on file. (Ophthalmic Drugs)   No current facility-administered medications for this visit. (Ophthalmic Drugs)   Current Outpatient Medications (Other)  Medication Sig   benazepril (LOTENSIN) 5 MG tablet Take 5 mg by mouth daily.   buPROPion (WELLBUTRIN XL) 300 MG 24 hr tablet Take 300 mg by mouth daily. Reported on 02/13/2016   fenofibrate 160 MG tablet Take 160 mg by mouth daily. Reported on 02/13/2016   gemfibrozil (LOPID) 600 MG tablet Take 600 mg by mouth 2 (two) times daily before a meal.   metFORMIN (GLUCOPHAGE) 1000 MG tablet Take 1,000 mg by mouth 2 (two) times daily with a meal.   norgestimate-ethinyl estradiol (SPRINTEC 28) 0.25-35 MG-MCG tablet Take 1 tablet by mouth daily. (Patient not taking: Reported on 02/13/2016)   ondansetron (ZOFRAN ODT) 4 MG disintegrating tablet Take 1 tablet (4 mg total) by mouth every 8 (eight) hours as needed for nausea or vomiting.   sitaGLIPtin (JANUVIA) 100 MG tablet Take 100 mg by mouth daily.   tamsulosin (FLOMAX) 0.4 MG CAPS capsule Take 1  capsule (0.4 mg total) by mouth daily.   No current facility-administered medications for this visit. (Other)      REVIEW OF SYSTEMS:    ALLERGIES No Known Allergies  PAST MEDICAL HISTORY Past Medical History:  Diagnosis Date   Depression    Diabetes mellitus    TYPE II   Hypercholesterolemia    Serous cystadenofibroma    OF RIGHT OVARY,, WITH SCATTERED FOCI, SEROUS BODERLINE TUMOR.   Past Surgical History:  Procedure Laterality Date   PELVIC LAPAROSCOPY     RIGHT OVARIAN CYSTECTOMY, PARTIAL OOPHORECTOMY    FAMILY HISTORY Family History  Problem Relation Age of Onset   Diabetes Maternal Grandmother    Hypertension Maternal Grandfather    Cancer Maternal Grandfather        COLON, SKIN    SOCIAL HISTORY Social History   Tobacco Use   Smoking status: Never   Smokeless tobacco: Never  Substance Use Topics   Alcohol use: Not Currently   Drug use: No         OPHTHALMIC EXAM:  Base Eye Exam     Visual Acuity (ETDRS)       Right Left   Dist cc 20/30 -2+2 20/25    Correction: Glasses  Tonometry (Tonopen, 8:10 AM)       Right Left   Pressure 10 15         Pupils       Dark Light Shape APD   Right 5 4 Irregular None   Left 5 4.5  None         Extraocular Movement       Right Left    Full Full         Neuro/Psych     Oriented x3: Yes   Mood/Affect: Normal         Dilation     Both eyes: 1.0% Mydriacyl, 2.5% Phenylephrine @ 8:10 AM           Slit Lamp and Fundus Exam     External Exam       Right Left   External Normal Normal         Slit Lamp Exam       Right Left   Lids/Lashes Normal Normal   Conjunctiva/Sclera White and quiet White and quiet   Cornea Clear Clear   Anterior Chamber Deep and quiet Deep and quiet   Iris Round and reactive Round and reactive   Lens Clear Clear   Anterior Vitreous Normal Normal         Fundus Exam       Right Left   Posterior Vitreous Normal Normal   Disc  Normal Neovascularization 10-c size, flat, stable since October   C/D Ratio 0.25 0.25   Macula Macular thickening, Microaneurysms, Mild clinically significant macular edema Macular thickening, Microaneurysms, Mild clinically significant macular edema   Vessels NPDR-Severe, NPDR-Severe, very   Periphery Good peripheral anterior PRP Good PRP nearly 360, some room superiorly for completion            IMAGING AND PROCEDURES  Imaging and Procedures for 01/07/22  OCT, Retina - OU - Both Eyes       Right Eye Quality was good. Central Foveal Thickness: 390. Progression has worsened. Findings include abnormal foveal contour, cystoid macular edema.   Left Eye Quality was good. Scan locations included subfoveal. Central Foveal Thickness: 314. Progression has improved.   Notes Macular thickening OD temporal to fovea, increased, signifying C ostomy.  We will need to consider Intravitreal anti-VEGF right eye to control this juxta foveal CSME in attempt to prevent progression     Color Fundus Photography Optos - OU - Both Eyes       Right Eye Progression has improved. Disc findings include normal observations. Macula : microaneurysms, edema.   Left Eye Progression has improved. Disc findings include neovascularization. Macula : microaneurysms.   Notes OD with severe NPDR and no progression to PDR on clinical examination nor documented on color fundus photography. Stable yet active  of PDR with neovascularization of the disc OS, approximately 10 -C standard photo size, flat not elevated. Room for additional PRP completion posteriorly, posterior to the equator              ASSESSMENT/PLAN:  Severe nonproliferative diabetic retinopathy of right eye, with macular edema, associated with type 2 diabetes mellitus (HCC) CSME paracentral, new today, some 3  months after most recent visit.  Will need antivegF in the near future  Proliferative diabetic retinopathy of left eye with  macular edema associated with type 2 diabetes mellitus ([place]) Much improved OS still with active neovascularization of the nerve none elevated, will likely need posterior PRP completed in the future  Vitreomacular adhesion, left  Observe for now os     ICD-10-CM   1. Severe nonproliferative diabetic retinopathy of right eye, with macular edema, associated with type 2 diabetes mellitus (HCC)  E11.3411 OCT, Retina - OU - Both Eyes    Color Fundus Photography Optos - OU - Both Eyes    2. Proliferative diabetic retinopathy of left eye with macular edema associated with type 2 diabetes mellitus ([place])  K59.9774     3. Vitreomacular adhesion, left  H43.822       1.  OD, with new onset CSME in the setting of severe NPDR.  No progression to PDR post PRP OD goals post fluorescein angiography right eye October 2022 to this have occurred with prevention of PDR progression OD.  Will need antivegF OD in the near future  2.  OS, much less active PDR, no further progression yet still with NVD of the nerve greater than standard photo 10-C.  Likely will need completion of PRP posterior to previous treatment  3.  OS will likely need PRP in 2 to 6 weeks  Ophthalmic Meds Ordered this visit:  No orders of the defined types were placed in this encounter.      Return in about 1 week (around 01/14/2022) for dilate, OD, AVASTIN OCT.  There are no Patient Instructions on file for this visit.   Explained the diagnoses, plan, and follow up with the patient and they expressed understanding.  Patient expressed understanding of the importance of proper follow up care.   Clent Demark Iyesha Such M.D. Diseases & Surgery of the Retina and Vitreous Retina & Diabetic [place] 01/07/22     Abbreviations: M myopia (nearsighted); A astigmatism; H hyperopia (farsighted); P presbyopia; Mrx spectacle prescription;  CTL contact lenses; OD right eye; OS left eye; OU both eyes  XT exotropia; ET esotropia; PEK punctate epithelial  keratitis; PEE punctate epithelial erosions; DES dry eye syndrome; MGD meibomian gland dysfunction; ATs artificial tears; PFAT's preservative free artificial tears; [place] nuclear sclerotic cataract; PSC posterior subcapsular cataract; ERM epi-retinal membrane; PVD posterior vitreous detachment; RD retinal detachment; DM diabetes mellitus; DR diabetic retinopathy; NPDR non-proliferative diabetic retinopathy; PDR proliferative diabetic retinopathy; CSME clinically significant macular edema; DME diabetic macular edema; dbh dot blot hemorrhages; CWS cotton wool spot; POAG primary open angle glaucoma; C/D cup-to-disc ratio; HVF humphrey visual field; GVF goldmann visual field; OCT optical coherence tomography; IOP intraocular pressure; BRVO Branch retinal vein occlusion; CRVO central retinal vein occlusion; CRAO central retinal artery occlusion; BRAO branch retinal artery occlusion; RT retinal tear; SB scleral buckle; PPV pars plana vitrectomy; VH Vitreous hemorrhage; PRP panretinal laser photocoagulation; IVK intravitreal kenalog; VMT vitreomacular traction; MH Macular hole;  NVD neovascularization of the disc; NVE neovascularization elsewhere; AREDS age related eye disease study; ARMD age related macular degeneration; POAG primary open angle glaucoma; EBMD epithelial/anterior basement membrane dystrophy; ACIOL anterior chamber intraocular lens; IOL intraocular lens; PCIOL posterior chamber intraocular lens; Phaco/IOL phacoemulsification with intraocular lens placement; [place] photorefractive keratectomy; LASIK laser assisted in situ keratomileusis; HTN hypertension; DM diabetes mellitus; COPD chronic obstructive pulmonary disease

## 2022-01-07 NOTE — Assessment & Plan Note (Signed)
CSME paracentral, new today, some 3  months after most recent visit.  Will need antivegF in the near future

## 2022-01-07 NOTE — Assessment & Plan Note (Signed)
Observe for now os

## 2022-01-14 ENCOUNTER — Other Ambulatory Visit: Payer: Self-pay

## 2022-01-14 ENCOUNTER — Ambulatory Visit (INDEPENDENT_AMBULATORY_CARE_PROVIDER_SITE_OTHER): Payer: No Typology Code available for payment source | Admitting: Ophthalmology

## 2022-01-14 ENCOUNTER — Encounter (INDEPENDENT_AMBULATORY_CARE_PROVIDER_SITE_OTHER): Payer: Self-pay | Admitting: Ophthalmology

## 2022-01-14 DIAGNOSIS — E113512 Type 2 diabetes mellitus with proliferative diabetic retinopathy with macular edema, left eye: Secondary | ICD-10-CM

## 2022-01-14 DIAGNOSIS — E113411 Type 2 diabetes mellitus with severe nonproliferative diabetic retinopathy with macular edema, right eye: Secondary | ICD-10-CM | POA: Diagnosis not present

## 2022-01-14 MED ORDER — BEVACIZUMAB 2.5 MG/0.1ML IZ SOSY
2.5000 mg | PREFILLED_SYRINGE | INTRAVITREAL | Status: AC | PRN
  Administered 2022-01-14: 2.5 mg via INTRAVITREAL

## 2022-01-14 NOTE — Assessment & Plan Note (Signed)
OS we will plan for posterior PRP and regions of retinal nonperfusion that have not been treated in the past, will plan in the coming weeks

## 2022-01-14 NOTE — Progress Notes (Addendum)
01/14/2022     CHIEF COMPLAINT Patient presents for  Chief Complaint  Patient presents with   Diabetic Retinopathy with Macular Edema      HISTORY OF PRESENT ILLNESS: Tiffany Mills is a 36 y.o. female who presents to the clinic today for:   HPI   Follow-up today for center involved CSME discovered last week.  Plan is to inject intravitreal Avastin today  No dilation OU today Last edited by Hurman Horn, MD on 01/14/2022  8:15 AM.      Referring physician: [name], NP [place],  [place] [id number]  HISTORICAL INFORMATION:   Selected notes from the MEDICAL RECORD NUMBER       CURRENT MEDICATIONS: No current outpatient medications on file. (Ophthalmic Drugs)   No current facility-administered medications for this visit. (Ophthalmic Drugs)   Current Outpatient Medications (Other)  Medication Sig   benazepril (LOTENSIN) 5 MG tablet Take 5 mg by mouth daily.   buPROPion (WELLBUTRIN XL) 300 MG 24 hr tablet Take 300 mg by mouth daily. Reported on 02/13/2016   fenofibrate 160 MG tablet Take 160 mg by mouth daily. Reported on 02/13/2016   gemfibrozil (LOPID) 600 MG tablet Take 600 mg by mouth 2 (two) times daily before a meal.   metFORMIN (GLUCOPHAGE) 1000 MG tablet Take 1,000 mg by mouth 2 (two) times daily with a meal.   norgestimate-ethinyl estradiol (SPRINTEC 28) 0.25-35 MG-MCG tablet Take 1 tablet by mouth daily. (Patient not taking: Reported on 02/13/2016)   ondansetron (ZOFRAN ODT) 4 MG disintegrating tablet Take 1 tablet (4 mg total) by mouth every 8 (eight) hours as needed for nausea or vomiting.   sitaGLIPtin (JANUVIA) 100 MG tablet Take 100 mg by mouth daily.   tamsulosin (FLOMAX) 0.4 MG CAPS capsule Take 1 capsule (0.4 mg total) by mouth daily.   No current facility-administered medications for this visit. (Other)      REVIEW OF SYSTEMS: ROS   Negative for: Constitutional, Gastrointestinal, Neurological, Skin, Genitourinary,  Musculoskeletal, HENT, Endocrine, Cardiovascular, Eyes, Respiratory, Psychiatric, Allergic/Imm, Heme/Lymph Last edited by Hurman Horn, MD on 01/14/2022  8:07 AM.       ALLERGIES No Known Allergies  PAST MEDICAL HISTORY Past Medical History:  Diagnosis Date   Depression    Diabetes mellitus    TYPE II   Hypercholesterolemia    Serous cystadenofibroma    OF RIGHT OVARY,, WITH SCATTERED FOCI, SEROUS BODERLINE TUMOR.   Past Surgical History:  Procedure Laterality Date   PELVIC LAPAROSCOPY     RIGHT OVARIAN CYSTECTOMY, PARTIAL OOPHORECTOMY    FAMILY HISTORY Family History  Problem Relation Age of Onset   Diabetes Maternal Grandmother    Hypertension Maternal Grandfather    Cancer Maternal Grandfather        COLON, SKIN    SOCIAL HISTORY Social History   Tobacco Use   Smoking status: Never   Smokeless tobacco: Never  Substance Use Topics   Alcohol use: Not Currently   Drug use: No         OPHTHALMIC EXAM:  Base Eye Exam     Visual Acuity (ETDRS)       Right Left   Dist cc 20/25 -1 20/30 -1    Correction: Glasses         Tonometry (Tonopen, 810am)       Right Left   Pressure 6 10         Neuro/Psych     Oriented x3:  Yes   Mood/Affect: Normal           Slit Lamp and Fundus Exam     External Exam       Right Left   External Normal Normal         Slit Lamp Exam       Right Left   Lids/Lashes Normal Normal   Conjunctiva/Sclera White and quiet White and quiet   Cornea Clear Clear   Anterior Chamber Deep and quiet Deep and quiet   Iris Round and reactive Round and reactive   Lens Clear Clear   Anterior Vitreous Normal Normal         Fundus Exam       Right Left   C/D Ratio 0.25             IMAGING AND PROCEDURES  Imaging and Procedures for 01/14/22  OCT, Retina - OU - Both Eyes       Right Eye Quality was good. Central Foveal Thickness: 358. Progression has worsened. Findings include abnormal foveal  contour, cystoid macular edema.   Left Eye Quality was good. Scan locations included subfoveal. Central Foveal Thickness: 316. Progression has improved.   Notes Macular thickening OD temporal to fovea, increased, signifying C ostomy.  We will deliver Intravitreal anti-VEGF right eye to control this juxta foveal CSME in attempt to prevent progression     Intravitreal Injection, Pharmacologic Agent - OD - Right Eye       Time Out 01/14/2022. 8:16 AM. Confirmed correct patient, procedure, site, and patient consented.   Anesthesia Topical anesthesia was used. Anesthetic medications included Lidocaine 4%.   Procedure Preparation included Tobramycin 0.3%, 10% betadine to eyelids, 5% betadine to ocular surface. A supplied needle was used.   Injection: 2.5 mg bevacizumab 2.5 MG/0.1ML   Route: Intravitreal, Site: Right Eye   NDC: [phone], Lot: [id number]   Post-op Post injection exam found visual acuity of at least counting fingers. The patient tolerated the procedure well. There were no complications. The patient received written and verbal post procedure care education. Post injection medications were not given.              ASSESSMENT/PLAN:  Severe nonproliferative diabetic retinopathy of right eye, with macular edema, associated with type 2 diabetes mellitus (HCC) CSME OD, recurrent, found on OCT last week, will commence with antivegF, Avastin OD today  Proliferative diabetic retinopathy of left eye with macular edema associated with type 2 diabetes mellitus ([place]) OS we will plan for posterior PRP and regions of retinal nonperfusion that have not been treated in the past, will plan in the coming weeks     ICD-10-CM   1. Severe nonproliferative diabetic retinopathy of right eye, with macular edema, associated with type 2 diabetes mellitus (HCC)  E11.3411 OCT, Retina - OU - Both Eyes    Intravitreal Injection, Pharmacologic Agent - OD - Right Eye    bevacizumab (AVASTIN)  SOSY 2.5 mg    2. Proliferative diabetic retinopathy of left eye with macular edema associated with type 2 diabetes mellitus ([place])  K59.9357       1.  OD, center involved CSME, for restart of antivegF therapy today to resolve center involved CSME.  2.  OS with progressive PDR, with large neovascularization of the disc.  Needs posterior PRP next visit in May and the fact in the future need antivegF left eye to completely resolve the neovascularization of the disc  3.  Ophthalmic Meds Ordered this visit:  Meds ordered this encounter  Medications   bevacizumab (AVASTIN) SOSY 2.5 mg       Return in about 2 weeks (around 01/28/2022) for COLOR FP, dilate, OS, PRP.  There are no Patient Instructions on file for this visit.   Explained the diagnoses, plan, and follow up with the patient and they expressed understanding.  Patient expressed understanding of the importance of proper follow up care.   Clent Demark Ambyr Qadri M.D. Diseases & Surgery of the Retina and Vitreous Retina & Diabetic [place] 01/14/22     Abbreviations: M myopia (nearsighted); A astigmatism; H hyperopia (farsighted); P presbyopia; Mrx spectacle prescription;  CTL contact lenses; OD right eye; OS left eye; OU both eyes  XT exotropia; ET esotropia; PEK punctate epithelial keratitis; PEE punctate epithelial erosions; DES dry eye syndrome; MGD meibomian gland dysfunction; ATs artificial tears; PFAT's preservative free artificial tears; [place] nuclear sclerotic cataract; PSC posterior subcapsular cataract; ERM epi-retinal membrane; PVD posterior vitreous detachment; RD retinal detachment; DM diabetes mellitus; DR diabetic retinopathy; NPDR non-proliferative diabetic retinopathy; PDR proliferative diabetic retinopathy; CSME clinically significant macular edema; DME diabetic macular edema; dbh dot blot hemorrhages; CWS cotton wool spot; POAG primary open angle glaucoma; C/D cup-to-disc ratio; HVF humphrey visual field; GVF goldmann  visual field; OCT optical coherence tomography; IOP intraocular pressure; BRVO Branch retinal vein occlusion; CRVO central retinal vein occlusion; CRAO central retinal artery occlusion; BRAO branch retinal artery occlusion; RT retinal tear; SB scleral buckle; PPV pars plana vitrectomy; VH Vitreous hemorrhage; PRP panretinal laser photocoagulation; IVK intravitreal kenalog; VMT vitreomacular traction; MH Macular hole;  NVD neovascularization of the disc; NVE neovascularization elsewhere; AREDS age related eye disease study; ARMD age related macular degeneration; POAG primary open angle glaucoma; EBMD epithelial/anterior basement membrane dystrophy; ACIOL anterior chamber intraocular lens; IOL intraocular lens; PCIOL posterior chamber intraocular lens; Phaco/IOL phacoemulsification with intraocular lens placement; [place] photorefractive keratectomy; LASIK laser assisted in situ keratomileusis; HTN hypertension; DM diabetes mellitus; COPD chronic obstructive pulmonary disease

## 2022-01-14 NOTE — Assessment & Plan Note (Signed)
CSME OD, recurrent, found on OCT last week, will commence with antivegF, Avastin OD today

## 2022-01-28 ENCOUNTER — Other Ambulatory Visit: Payer: Self-pay

## 2022-01-28 ENCOUNTER — Ambulatory Visit (INDEPENDENT_AMBULATORY_CARE_PROVIDER_SITE_OTHER): Payer: No Typology Code available for payment source | Admitting: Ophthalmology

## 2022-01-28 ENCOUNTER — Encounter (INDEPENDENT_AMBULATORY_CARE_PROVIDER_SITE_OTHER): Payer: Self-pay | Admitting: Ophthalmology

## 2022-01-28 DIAGNOSIS — E113512 Type 2 diabetes mellitus with proliferative diabetic retinopathy with macular edema, left eye: Secondary | ICD-10-CM | POA: Diagnosis not present

## 2022-01-28 DIAGNOSIS — E113411 Type 2 diabetes mellitus with severe nonproliferative diabetic retinopathy with macular edema, right eye: Secondary | ICD-10-CM

## 2022-01-28 NOTE — Assessment & Plan Note (Signed)
Peripheral PRP in the past, with progressive NVD, posterior PRP, applied today 7225 applications

## 2022-01-28 NOTE — Assessment & Plan Note (Addendum)
Post recent injection antivegF medication, some 2 weeks previous.  Follow-up in 2 to 4 weeks for evaluation of macula, OU

## 2022-01-28 NOTE — Progress Notes (Signed)
01/28/2022     CHIEF COMPLAINT Patient presents for  Chief Complaint  Patient presents with   Diabetic Retinopathy with Macular Edema      HISTORY OF PRESENT ILLNESS: Tiffany Mills is a 36 y.o. female who presents to the clinic today for:   HPI   2 week fu FP, dilate OS, PRP. Patient states vision is stable and unchanged since last visit. Denies any new floaters or FOL.  Last edited by Laurin Coder on 01/28/2022  8:05 AM.      Referring physician: [name], NP [place],  [place] [phone]  HISTORICAL INFORMATION:   Selected notes from the [facility]: No current outpatient medications on file. (Ophthalmic Drugs)   No current facility-administered medications for this visit. (Ophthalmic Drugs)   Current Outpatient Medications (Other)  Medication Sig   benazepril (LOTENSIN) 5 MG tablet Take 5 mg by mouth daily.   buPROPion (WELLBUTRIN XL) 300 MG 24 hr tablet Take 300 mg by mouth daily. Reported on 02/13/2016   fenofibrate 160 MG tablet Take 160 mg by mouth daily. Reported on 02/13/2016   gemfibrozil (LOPID) 600 MG tablet Take 600 mg by mouth 2 (two) times daily before a meal.   metFORMIN (GLUCOPHAGE) 1000 MG tablet Take 1,000 mg by mouth 2 (two) times daily with a meal.   norgestimate-ethinyl estradiol (SPRINTEC 28) 0.25-35 MG-MCG tablet Take 1 tablet by mouth daily. (Patient not taking: Reported on 02/13/2016)   ondansetron (ZOFRAN ODT) 4 MG disintegrating tablet Take 1 tablet (4 mg total) by mouth every 8 (eight) hours as needed for nausea or vomiting.   sitaGLIPtin (JANUVIA) 100 MG tablet Take 100 mg by mouth daily.   tamsulosin (FLOMAX) 0.4 MG CAPS capsule Take 1 capsule (0.4 mg total) by mouth daily.   No current facility-administered medications for this visit. (Other)      REVIEW OF SYSTEMS:    ALLERGIES No Known Allergies  PAST MEDICAL HISTORY Past Medical History:  Diagnosis  Date   Depression    Diabetes mellitus    TYPE II   Hypercholesterolemia    Serous cystadenofibroma    OF RIGHT OVARY,, WITH SCATTERED FOCI, SEROUS BODERLINE TUMOR.   Past Surgical History:  Procedure Laterality Date   PELVIC LAPAROSCOPY     RIGHT OVARIAN CYSTECTOMY, PARTIAL OOPHORECTOMY    FAMILY HISTORY Family History  Problem Relation Age of Onset   Diabetes Maternal Grandmother    Hypertension Maternal Grandfather    Cancer Maternal Grandfather        COLON, SKIN    SOCIAL HISTORY Social History   Tobacco Use   Smoking status: Never   Smokeless tobacco: Never  Substance Use Topics   Alcohol use: Not Currently   Drug use: No         OPHTHALMIC EXAM:  Base Eye Exam     Visual Acuity (ETDRS)       Right Left   Dist cc 20/30 -1 20/25 -1    Correction: Glasses         Tonometry (Tonopen, 8:08 AM)       Right Left   Pressure 14 14         Pupils       Pupils Dark Light APD   Right PERRL 5 4 None   Left PERRL 5 4 None         Extraocular Movement  Right Left    Full Full         Neuro/Psych     Oriented x3: Yes   Mood/Affect: Normal         Dilation     Left eye: 1.0% Mydriacyl, 2.5% Phenylephrine @ 8:08 AM           Slit Lamp and Fundus Exam     External Exam       Right Left   External Normal Normal         Slit Lamp Exam       Right Left   Lids/Lashes Normal Normal   Conjunctiva/Sclera White and quiet White and quiet   Cornea Clear Clear   Anterior Chamber Deep and quiet Deep and quiet   Iris Round and reactive Round and reactive   Lens Clear Clear   Anterior Vitreous Normal Normal         Fundus Exam       Right Left   Posterior Vitreous  Normal   Disc  Neovascularization 10-c size, flat, stable since October   C/D Ratio  0.25   Macula  Macular thickening, Microaneurysms, Mild clinically significant macular edema   Vessels  NPDR-Severe, very   Periphery  Good PRP nearly 360, some room  superiorly for completion            IMAGING AND PROCEDURES  Imaging and Procedures for 01/28/22  Panretinal Photocoagulation - OS - Left Eye       Time Out Confirmed correct patient, procedure, site, and patient consented.   Anesthesia Topical anesthesia was used. Anesthetic medications included Proparacaine 0.5%.   Laser Information The type of laser was diode. Color was yellow. The duration in seconds was 0.03. The spot size was 390 microns. Laser power was 360. Total spots was 1074.   Post-op The patient tolerated the procedure well. There were no complications. The patient received written and verbal post procedure care education.   Notes PRP delivered posterior portion of the retina posterior to the equator but outside the posterior pole outside the clinicians macula circumferentially from progressive NVD despite previous anterior PRP     Color Fundus Photography Optos - OU - Both Eyes       Right Eye Progression has improved. Disc findings include normal observations. Macula : microaneurysms, edema.   Left Eye Progression has improved. Disc findings include neovascularization. Macula : microaneurysms.   Notes OD with severe NPDR and no progression to PDR on clinical examination nor documented on color fundus photography. Stable yet active  of PDR with neovascularization of the disc OS, approximately 10 -C standard photo size, flat not elevated. Room for additional PRP completion posteriorly, posterior to the equator, completed today              ASSESSMENT/PLAN:  Severe nonproliferative diabetic retinopathy of right eye, with macular edema, associated with type 2 diabetes mellitus ([place]) Post recent injection antivegF medication, some 2 weeks previous.  Follow-up in 2 to 4 weeks for evaluation of macula, OU  Proliferative diabetic retinopathy of left eye with macular edema associated with type 2 diabetes mellitus (HCC) Peripheral PRP in the past, with  progressive NVD, posterior PRP, applied today 5809 applications     [id number]   1. Proliferative diabetic retinopathy of left eye with macular edema associated with type 2 diabetes mellitus (HCC)  K53.9767 Panretinal Photocoagulation - OS - Left Eye    Color Fundus Photography Optos - OU - Both Eyes  CANCELED: OCT, Retina - OU - Both Eyes    2. Severe nonproliferative diabetic retinopathy of right eye, with macular edema, associated with type 2 diabetes mellitus ([place])  E11.3411       1.  OS with progressive PDR with neovascularization of the disc growing despite peripheral anterior PRP of the retina. Posterior PRP applied posterior to the equator 360 today  2.  3.  Ophthalmic Meds Ordered this visit:  No orders of the defined types were placed in this encounter.      Return in about 4 weeks (around 02/25/2022) for DILATE OU, OCT possible Avastin OD.  There are no Patient Instructions on file for this visit.   Explained the diagnoses, plan, and follow up with the patient and they expressed understanding.  Patient expressed understanding of the importance of proper follow up care.   Clent Demark Aniqua Briere M.D. Diseases & Surgery of the Retina and Vitreous Retina & Diabetic [place] 01/28/22     Abbreviations: M myopia (nearsighted); A astigmatism; H hyperopia (farsighted); P presbyopia; Mrx spectacle prescription;  CTL contact lenses; OD right eye; OS left eye; OU both eyes  XT exotropia; ET esotropia; PEK punctate epithelial keratitis; PEE punctate epithelial erosions; DES dry eye syndrome; MGD meibomian gland dysfunction; ATs artificial tears; PFAT's preservative free artificial tears; [place] nuclear sclerotic cataract; PSC posterior subcapsular cataract; ERM epi-retinal membrane; PVD posterior vitreous detachment; RD retinal detachment; DM diabetes mellitus; DR diabetic retinopathy; NPDR non-proliferative diabetic retinopathy; PDR proliferative diabetic retinopathy; CSME clinically  significant macular edema; DME diabetic macular edema; dbh dot blot hemorrhages; CWS cotton wool spot; POAG primary open angle glaucoma; C/D cup-to-disc ratio; HVF humphrey visual field; GVF goldmann visual field; OCT optical coherence tomography; IOP intraocular pressure; BRVO Branch retinal vein occlusion; CRVO central retinal vein occlusion; CRAO central retinal artery occlusion; BRAO branch retinal artery occlusion; RT retinal tear; SB scleral buckle; PPV pars plana vitrectomy; VH Vitreous hemorrhage; PRP panretinal laser photocoagulation; IVK intravitreal kenalog; VMT vitreomacular traction; MH Macular hole;  NVD neovascularization of the disc; NVE neovascularization elsewhere; AREDS age related eye disease study; ARMD age related macular degeneration; POAG primary open angle glaucoma; EBMD epithelial/anterior basement membrane dystrophy; ACIOL anterior chamber intraocular lens; IOL intraocular lens; PCIOL posterior chamber intraocular lens; Phaco/IOL phacoemulsification with intraocular lens placement; [place] photorefractive keratectomy; LASIK laser assisted in situ keratomileusis; HTN hypertension; DM diabetes mellitus; COPD chronic obstructive pulmonary disease

## 2022-02-26 ENCOUNTER — Ambulatory Visit (INDEPENDENT_AMBULATORY_CARE_PROVIDER_SITE_OTHER): Payer: No Typology Code available for payment source | Admitting: Ophthalmology

## 2022-02-26 ENCOUNTER — Other Ambulatory Visit: Payer: Self-pay

## 2022-02-26 ENCOUNTER — Encounter (INDEPENDENT_AMBULATORY_CARE_PROVIDER_SITE_OTHER): Payer: Self-pay | Admitting: Ophthalmology

## 2022-02-26 DIAGNOSIS — E113411 Type 2 diabetes mellitus with severe nonproliferative diabetic retinopathy with macular edema, right eye: Secondary | ICD-10-CM | POA: Diagnosis not present

## 2022-02-26 DIAGNOSIS — H43822 Vitreomacular adhesion, left eye: Secondary | ICD-10-CM | POA: Diagnosis not present

## 2022-02-26 DIAGNOSIS — E113512 Type 2 diabetes mellitus with proliferative diabetic retinopathy with macular edema, left eye: Secondary | ICD-10-CM

## 2022-02-26 MED ORDER — BEVACIZUMAB 2.5 MG/0.1ML IZ SOSY
2.5000 mg | PREFILLED_SYRINGE | INTRAVITREAL | Status: AC | PRN
  Administered 2022-02-26: 2.5 mg via INTRAVITREAL

## 2022-02-26 NOTE — Assessment & Plan Note (Signed)
Present on the nasal aspect of the FAZ OS but no impact on acuity ?

## 2022-02-26 NOTE — Assessment & Plan Note (Signed)
Macular edema is slightly increased, will likely need intravitreal Avastin OS to resolve this ?

## 2022-02-26 NOTE — Progress Notes (Signed)
? ? ?02/26/2022 ? ?  ? ?CHIEF COMPLAINT ?Patient presents for  ?Chief Complaint  ?Patient presents with  ? Diabetic Retinopathy with Macular Edema  ? ? ? ? ?HISTORY OF PRESENT ILLNESS: ?Tiffany Mills is a 36 y.o. female who presents to the clinic today for:  ? ?HPI   ?4 mos DILATE OU, OCT, possible Avastin OD. ?Patient states vision has "improved a little bit maybe," since last visit. Denies any new floaters or FOL. ?Pt states her monitor might not be correct and she has had to calibrate it a few times for her BS.  ?Last edited by Laurin Coder on 02/26/2022  8:23 AM.  ?  ? ? ?Referring physician: ?[name], NP ?[facility] ?[place],  [place] [id number] ? ?HISTORICAL INFORMATION:  ? ?Selected notes from the [facility] ?  ?   ? ?CURRENT MEDICATIONS: ?No current outpatient medications on file. (Ophthalmic Drugs)  ? ?No current facility-administered medications for this visit. (Ophthalmic Drugs)  ? ?Current Outpatient Medications (Other)  ?Medication Sig  ? benazepril (LOTENSIN) 5 MG tablet Take 5 mg by mouth daily.  ? buPROPion (WELLBUTRIN XL) 300 MG 24 hr tablet Take 300 mg by mouth daily. Reported on 02/13/2016  ? fenofibrate 160 MG tablet Take 160 mg by mouth daily. Reported on 02/13/2016  ? gemfibrozil (LOPID) 600 MG tablet Take 600 mg by mouth 2 (two) times daily before a meal.  ? metFORMIN (GLUCOPHAGE) 1000 MG tablet Take 1,000 mg by mouth 2 (two) times daily with a meal.  ? norgestimate-ethinyl estradiol (SPRINTEC 28) 0.25-35 MG-MCG tablet Take 1 tablet by mouth daily. (Patient not taking: Reported on 02/13/2016)  ? ondansetron (ZOFRAN ODT) 4 MG disintegrating tablet Take 1 tablet (4 mg total) by mouth every 8 (eight) hours as needed for nausea or vomiting.  ? sitaGLIPtin (JANUVIA) 100 MG tablet Take 100 mg by mouth daily.  ? tamsulosin (FLOMAX) 0.4 MG CAPS capsule Take 1 capsule (0.4 mg total) by mouth daily.  ? ?No current facility-administered medications for this visit. (Other)   ? ? ? ? ?REVIEW OF SYSTEMS: ? ? ? ?ALLERGIES ?No Known Allergies ? ?PAST MEDICAL HISTORY ?Past Medical History:  ?Diagnosis Date  ? Depression   ? Diabetes mellitus   ? TYPE II  ? Hypercholesterolemia   ? 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  ? ?Past Surgical History:  ?Procedure Laterality Date  ? PELVIC LAPAROSCOPY    ? RIGHT OVARIAN CYSTECTOMY, PARTIAL OOPHORECTOMY  ? ? ?FAMILY HISTORY ?Family History  ?Problem Relation Age of Onset  ? Diabetes Maternal Grandmother   ? Hypertension Maternal Grandfather   ? Cancer Maternal Grandfather   ?     COLON, SKIN  ? ? ?SOCIAL HISTORY ?Social History  ? ?Tobacco Use  ? Smoking status: Never  ? Smokeless tobacco: Never  ?Substance Use Topics  ? Alcohol use: Not Currently  ? Drug use: No  ? ?  ? ?  ? ?OPHTHALMIC EXAM: ? ?Base Eye Exam   ? ? Visual Acuity (ETDRS)   ? ?   Right Left  ? Dist cc 20/25 -2 20/25  ? ? Correction: Glasses  ? ?  ?  ? ? Tonometry (Tonopen, 8:24 AM)   ? ?   Right Left  ? Pressure 10 10  ? ?  ?  ? ? Pupils   ? ?   Pupils Dark Light APD  ? Right PERRL 5  4 None  ? Left PERRL 5 4.5 None  ? ?  ?  ? ? Extraocular Movement   ? ?   Right Left  ?  Full Full  ? ?  ?  ? ? Neuro/Psych   ? ? Oriented x3: Yes  ? Mood/Affect: Normal  ? ?  ?  ? ? Dilation   ? ? Both eyes: 1.0% Mydriacyl, 2.5% Phenylephrine @ 8:24 AM  ? ?  ?  ? ?  ? ?Slit Lamp and Fundus Exam   ? ? External Exam   ? ?   Right Left  ? External Normal Normal  ? ?  ?  ? ? Slit Lamp Exam   ? ?   Right Left  ? Lids/Lashes Normal Normal  ? Conjunctiva/Sclera White and quiet White and quiet  ? Cornea Clear Clear  ? Anterior Chamber Deep and quiet Deep and quiet  ? Iris Round and reactive Round and reactive  ? Lens Clear Clear  ? Anterior Vitreous Normal Normal  ? ?  ?  ? ? Fundus Exam   ? ?   Right Left  ? Posterior Vitreous Normal Normal  ? Disc Normal Neovascularization 10-c size, flat, stable since October  ? C/D Ratio 0.25 0.25  ? Macula Macular  thickening, Microaneurysms, Mild clinically significant macular edema Macular thickening, Microaneurysms, Mild clinically significant macular edema  ? Vessels NPDR-Severe, NPDR-Severe, very  ? Periphery Good peripheral anterior PRP Good PRP nearly 360, some room superiorly for completion  ? ?  ?  ? ?  ? ? ?IMAGING AND PROCEDURES  ?Imaging and Procedures for 02/26/22 ? ?OCT, Retina - OU - Both Eyes   ? ?   ?Right Eye ?Quality was good. Central Foveal Thickness: 329. Progression has improved. Findings include abnormal foveal contour, cystoid macular edema.  ? ?Left Eye ?Quality was good. Scan locations included subfoveal. Central Foveal Thickness: 346. Progression has worsened. Findings include abnormal foveal contour, cystoid macular edema, vitreomacular adhesion , vitreous traction.  ? ?Notes ?Macular thickening OD temporal to fovea, improved and much less extensive post injection Avastin and also peripheral PRP to decrease overall vegF burden, at some 6 weeks post most recent injection we will repeat today to maintain the ongoing improvement as compared to January 07, 2022 ? ?OS with with mild to moderate CSME on the nasal aspect of the FAZ, will likely need intravitreal injection OS soon, Avastin ? ?  ? ?Intravitreal Injection, Pharmacologic Agent - OD - Right Eye   ? ?   ?Time Out ?02/26/2022. 9:14 AM. Confirmed correct patient, procedure, site, and patient consented.  ? ?Anesthesia ?Topical anesthesia was used. Anesthetic medications included Lidocaine 4%.  ? ?Procedure ?Preparation included Tobramycin 0.3%, 10% betadine to eyelids, 5% betadine to ocular surface. A supplied needle was used.  ? ?Injection: ?2.5 mg bevacizumab 2.5 MG/0.1ML ?  Route: Intravitreal, Site: Right Eye ?  [place]: [id number], Lot: [id number]  ? ?Post-op ?Post injection exam found visual acuity of at least counting fingers. The patient tolerated the procedure well. There were no complications. The patient received written and verbal post  procedure care education. Post injection medications were not given.  ? ?  ? ? ?  ?  ? ?  ?ASSESSMENT/PLAN: ? ?Vitreomacular adhesion, left ?Present on the nasal aspect of the FAZ OS but no impact on acuity ? ?Proliferative diabetic retinopathy of left eye with macular edema associated with type 2 diabetes mellitus ([place]) ?Macular edema is slightly increased, will likely need  intravitreal Avastin OS to resolve this ? ?Severe nonproliferative diabetic retinopathy of right eye, with macular edema, associated with type 2 diabetes mellitus ([place]) ?Macular edema the right eye is decreasing over time since early February 2023.  We will repeat injection today to maintain excellent acuity and resolve the CSME  ? ?  ICD-10-CM   ?1. Severe nonproliferative diabetic retinopathy of right eye, with macular edema, associated with type 2 diabetes mellitus (HCC)  E11.3411 Intravitreal Injection, Pharmacologic Agent - OD - Right Eye  ?  bevacizumab (AVASTIN) SOSY 2.5 mg  ?  ?2. Proliferative diabetic retinopathy of left eye with macular edema associated with type 2 diabetes mellitus (HCC)  D40.8144 OCT, Retina - OU - Both Eyes  ?  ?3. Vitreomacular adhesion, left  H43.822   ?  ? ? ?1.  OS less active PDR successful PRP delivery yet new increasing CSME center involved threatening.  Will likely need antivegF, Avastin near future ? ?2.  OD, much less active severe NPDR protected with good anterior PRP 360 now.  Much less active CSME centrally.  On Avastin last injection 6 weeks previous ? ?3.  Repeat injection intravitreal Avastin OD today ? ?Ophthalmic Meds Ordered this visit:  ?Meds ordered this encounter  ?Medications  ? bevacizumab (AVASTIN) SOSY 2.5 mg  ? ? ?  ? ?Return in about 1 week (around 03/05/2022) for dilate, OS, AVASTIN OCT. ? ?There are no Patient Instructions on file for this visit. ? ? ?Explained the diagnoses, plan, and follow up with the patient and they expressed understanding.  Patient expressed understanding of the  importance of proper follow up care.  ? ?Clent Demark. Tevis Dunavan M.D. ?Diseases & Surgery of the Retina and Vitreous ?[facility] ?02/26/22 ? ? ? ? ?Abbreviations: ?M myopia (nearsighted); A astigmatism; H hype

## 2022-02-26 NOTE — Assessment & Plan Note (Signed)
Macular edema the right eye is decreasing over time since early February 2023.  We will repeat injection today to maintain excellent acuity and resolve the CSME ?

## 2022-03-04 ENCOUNTER — Encounter (INDEPENDENT_AMBULATORY_CARE_PROVIDER_SITE_OTHER): Payer: Self-pay | Admitting: Ophthalmology

## 2022-03-04 ENCOUNTER — Ambulatory Visit (INDEPENDENT_AMBULATORY_CARE_PROVIDER_SITE_OTHER): Payer: No Typology Code available for payment source | Admitting: Ophthalmology

## 2022-03-04 DIAGNOSIS — E113512 Type 2 diabetes mellitus with proliferative diabetic retinopathy with macular edema, left eye: Secondary | ICD-10-CM | POA: Diagnosis not present

## 2022-03-04 DIAGNOSIS — E113411 Type 2 diabetes mellitus with severe nonproliferative diabetic retinopathy with macular edema, right eye: Secondary | ICD-10-CM

## 2022-03-04 DIAGNOSIS — H43822 Vitreomacular adhesion, left eye: Secondary | ICD-10-CM

## 2022-03-04 MED ORDER — BEVACIZUMAB 2.5 MG/0.1ML IZ SOSY
2.5000 mg | PREFILLED_SYRINGE | INTRAVITREAL | Status: AC | PRN
  Administered 2022-03-04: 2.5 mg via INTRAVITREAL

## 2022-03-04 NOTE — Assessment & Plan Note (Signed)
May have some impact on nasal CSME ?

## 2022-03-04 NOTE — Assessment & Plan Note (Addendum)
Prior active PDR much less active post panretinal photocoagulation.  Yet still with active CSME some component of vitreal macular traction going ?

## 2022-03-04 NOTE — Progress Notes (Signed)
? ? ?03/04/2022 ? ?  ? ?CHIEF COMPLAINT ?Patient presents for  ?Chief Complaint  ?Patient presents with  ? Diabetic Retinopathy with Macular Edema  ? ? ? ? ?HISTORY OF PRESENT ILLNESS: ?Tiffany Mills is a 36 y.o. female who presents to the clinic today for:  ? ?HPI   ?1 week Dilate OS, Avastin OS, OCT. ?Patient states vision is stable and unchanged since last visit. Denies any new floaters or FOL. ? ?Last edited by Laurin Coder on 03/04/2022  3:32 PM.  ?  ? ? ?Referring physician: ?[name], NP ?[facility] ?[place],  [place] [phone] ? ?HISTORICAL INFORMATION:  ? ?Selected notes from the [facility] ?  ?   ? ?CURRENT MEDICATIONS: ?No current outpatient medications on file. (Ophthalmic Drugs)  ? ?No current facility-administered medications for this visit. (Ophthalmic Drugs)  ? ?Current Outpatient Medications (Other)  ?Medication Sig  ? benazepril (LOTENSIN) 5 MG tablet Take 5 mg by mouth daily.  ? buPROPion (WELLBUTRIN XL) 300 MG 24 hr tablet Take 300 mg by mouth daily. Reported on 02/13/2016  ? fenofibrate 160 MG tablet Take 160 mg by mouth daily. Reported on 02/13/2016  ? gemfibrozil (LOPID) 600 MG tablet Take 600 mg by mouth 2 (two) times daily before a meal.  ? metFORMIN (GLUCOPHAGE) 1000 MG tablet Take 1,000 mg by mouth 2 (two) times daily with a meal.  ? norgestimate-ethinyl estradiol (SPRINTEC 28) 0.25-35 MG-MCG tablet Take 1 tablet by mouth daily. (Patient not taking: Reported on 02/13/2016)  ? ondansetron (ZOFRAN ODT) 4 MG disintegrating tablet Take 1 tablet (4 mg total) by mouth every 8 (eight) hours as needed for nausea or vomiting.  ? sitaGLIPtin (JANUVIA) 100 MG tablet Take 100 mg by mouth daily.  ? tamsulosin (FLOMAX) 0.4 MG CAPS capsule Take 1 capsule (0.4 mg total) by mouth daily.  ? ?No current facility-administered medications for this visit. (Other)  ? ? ? ? ?REVIEW OF SYSTEMS: ?ROS   ?Negative for: Constitutional, Gastrointestinal, Neurological, Skin, Genitourinary,  Musculoskeletal, HENT, Endocrine, Cardiovascular, Eyes, Respiratory, Psychiatric, Allergic/Imm, Heme/Lymph ?Last edited by Hurman Horn, MD on 03/04/2022  4:10 PM.  ?  ? ? ? ?ALLERGIES ?No Known Allergies ? ?PAST MEDICAL HISTORY ?Past Medical History:  ?Diagnosis Date  ? Depression   ? Diabetes mellitus   ? TYPE II  ? Hypercholesterolemia   ? 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  ? ?Past Surgical History:  ?Procedure Laterality Date  ? PELVIC LAPAROSCOPY    ? RIGHT OVARIAN CYSTECTOMY, PARTIAL OOPHORECTOMY  ? ? ?FAMILY HISTORY ?Family History  ?Problem Relation Age of Onset  ? Diabetes Maternal Grandmother   ? Hypertension Maternal Grandfather   ? Cancer Maternal Grandfather   ?     COLON, SKIN  ? ? ?SOCIAL HISTORY ?Social History  ? ?Tobacco Use  ? Smoking status: Never  ? Smokeless tobacco: Never  ?Substance Use Topics  ? Alcohol use: Not Currently  ? Drug use: No  ? ?  ? ?  ? ?OPHTHALMIC EXAM: ? ?Base Eye Exam   ? ? Visual Acuity (ETDRS)   ? ?   Right Left  ? Dist cc 20/25 20/25 -2  ? ? Correction: Glasses  ? ?  ?  ? ? Tonometry (Tonopen, 3:35 PM)   ? ?   Right Left  ? Pressure 11 13  ? ?  ?  ? ? Pupils   ? ?  Pupils APD  ? Right PERRL None  ? Left PERRL None  ? ?  ?  ? ? Extraocular Movement   ? ?   Right Left  ?  Full Full  ? ?  ?  ? ? Neuro/Psych   ? ? Oriented x3: Yes  ? Mood/Affect: Normal  ? ?  ?  ? ? Dilation   ? ? Left eye: 1.0% Mydriacyl, 2.5% Phenylephrine @ 3:35 PM  ? ?  ?  ? ?  ? ?Slit Lamp and Fundus Exam   ? ? External Exam   ? ?   Right Left  ? External Normal Normal  ? ?  ?  ? ? Slit Lamp Exam   ? ?   Right Left  ? Lids/Lashes Normal Normal  ? Conjunctiva/Sclera White and quiet White and quiet  ? Cornea Clear Clear  ? Anterior Chamber Deep and quiet Deep and quiet  ? Iris Round and reactive Round and reactive  ? Lens Clear Clear  ? Anterior Vitreous Normal Normal  ? ?  ?  ? ? Fundus Exam   ? ?   Right Left  ? Posterior Vitreous Normal Normal  ?  Disc Normal Neovascularization 10-c size now much smaller, flat, stable since October, less active less visible  ? C/D Ratio 0.25 0.25  ? Macula Macular thickening, Microaneurysms, Mild clinically significant macular edema Macular thickening, Microaneurysms, Mild clinically significant macular edema  ? Vessels NPDR-Severe, NPDR-Severe, very  ? Periphery Good peripheral anterior PRP, 360 Good PRP nearly 360, some room superiorly for completion, and nasally  ? ?  ?  ? ?  ? ? ?IMAGING AND PROCEDURES  ?Imaging and Procedures for 03/04/22 ? ?Intravitreal Injection, Pharmacologic Agent - OS - Left Eye   ? ?   ?Time Out ?03/04/2022. 4:13 PM. Confirmed correct patient, procedure, site, and patient consented.  ? ?Anesthesia ?Topical anesthesia was used. Anesthetic medications included Lidocaine 4%.  ? ?Procedure ?Preparation included Tobramycin 0.3%, 10% betadine to eyelids, 5% betadine to ocular surface. A 30 gauge needle was used.  ? ?Injection: ?2.5 mg bevacizumab 2.5 MG/0.1ML ?  Route: Intravitreal, Site: Left Eye ?  [place]: [id number], Lot: [id number]  ? ?Post-op ?Post injection exam found visual acuity of at least counting fingers. The patient tolerated the procedure well. There were no complications. The patient received written and verbal post procedure care education. Post injection medications were not given.  ? ?  ? ?OCT, Retina - OU - Both Eyes   ? ?   ?Right Eye ?Quality was good. Central Foveal Thickness: 334. Progression has improved. Findings include abnormal foveal contour, cystoid macular edema.  ? ?Left Eye ?Quality was good. Scan locations included subfoveal. Central Foveal Thickness: 346. Progression has worsened. Findings include abnormal foveal contour, cystoid macular edema, vitreomacular adhesion , vitreous traction.  ? ?Notes ?Macular thickening OD temporal to fovea, improved and much less extensive post injection Avastin and also peripheral PRP to decrease overall vegF burden, at some 6 days post most  recent injection with ongoing improvement as compared to January 07, 2022 ? ?OS with with mild to moderate CSME on the nasal aspect of the FAZ slightly worse as compared to 10 weeks previous yet still active, will likely need intravitreal injection OS today, Avastin ? ?  ? ? ?  ?  ? ?  ?ASSESSMENT/PLAN: ? ?Severe nonproliferative diabetic retinopathy of right eye, with macular edema, associated with type 2 diabetes mellitus ([place]) ?Improved 6 days after most recent  injection, stable CSME ? ?Proliferative diabetic retinopathy of left eye with macular edema associated with type 2 diabetes mellitus ([place]) ?Prior active PDR much less active post panretinal photocoagulation.  Yet still with active CSME some component of vitreal macular traction going ? ?Vitreomacular adhesion, left ?May have some impact on nasal CSME  ? ?  ICD-10-CM   ?1. Proliferative diabetic retinopathy of left eye with macular edema associated with type 2 diabetes mellitus (HCC)  R00.7622 Intravitreal Injection, Pharmacologic Agent - OS - Left Eye  ?  OCT, Retina - OU - Both Eyes  ?  bevacizumab (AVASTIN) SOSY 2.5 mg  ?  ?2. Severe nonproliferative diabetic retinopathy of right eye, with macular edema, associated with type 2 diabetes mellitus ([place])  Q33.3545   ?  ?3. Vitreomacular adhesion, left  H43.822   ?  ? ? ?1.  OS for repeat injection Avastin today to resolve and improved CSME paracentrally. ? ?2.  There may be some role of VMA VMT on the macula left eye ? ?3.  Improved macular findings OD. ? ?4.  We will dilate OU in 8 weeks and reevaluate at our next steps at that point ? ?Ophthalmic Meds Ordered this visit:  ?Meds ordered this encounter  ?Medications  ? bevacizumab (AVASTIN) SOSY 2.5 mg  ? ? ?  ? ?Return in about 8 weeks (around 04/29/2022) for DILATE OU, OCT. ? ?There are no Patient Instructions on file for this visit. ? ? ?Explained the diagnoses, plan, and follow up with the patient and they expressed understanding.  Patient expressed  understanding of the importance of proper follow up care.  ? ?Clent Demark. Maila Dukes M.D. ?Diseases & Surgery of the Retina and Vitreous ?[facility] ?03/04/22 ? ? ? ? ?Abbreviations: ?M myopia (nearsi

## 2022-03-04 NOTE — Assessment & Plan Note (Signed)
Improved 6 days after most recent injection, stable CSME ?

## 2022-03-05 ENCOUNTER — Encounter (INDEPENDENT_AMBULATORY_CARE_PROVIDER_SITE_OTHER): Payer: No Typology Code available for payment source | Admitting: Ophthalmology

## 2022-03-31 IMAGING — CT CT RENAL STONE PROTOCOL
2 of 4 series · 17 of 46 positions shown, 19 images · non-contrast
Comparison: April 07, 2009

CLINICAL DATA: Left-sided flank pain.

EXAM:
CT ABDOMEN AND PELVIS WITHOUT CONTRAST
TECHNIQUE: Multidetector CT imaging of the abdomen and pelvis was performed
following the standard protocol without IV contrast.

[Series 2: axial st · axial · 0.86mm/px · z∈[-336,+84]mm · 14 of 92 slices shown, 16 images]
[im 4/92  soft-tissue]
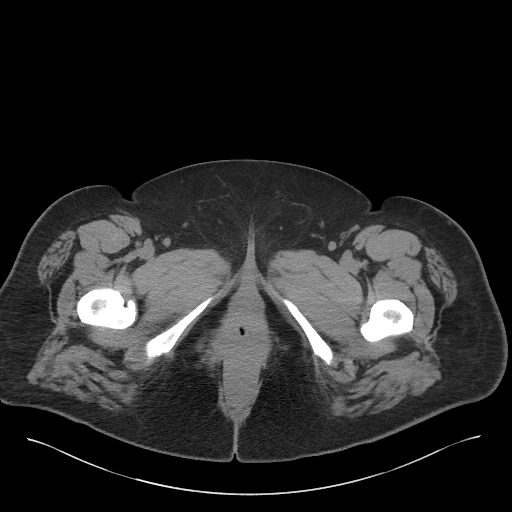
[im 4/92  bone]
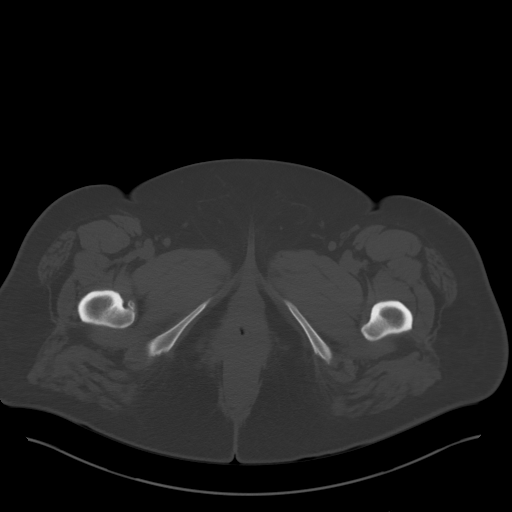
[im 11/92  soft-tissue]
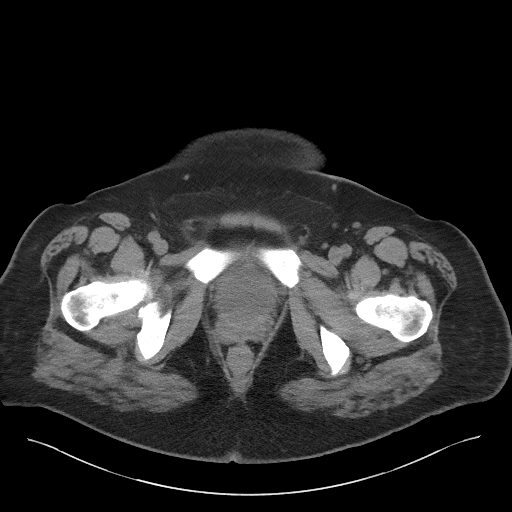
[im 18/92  soft-tissue]
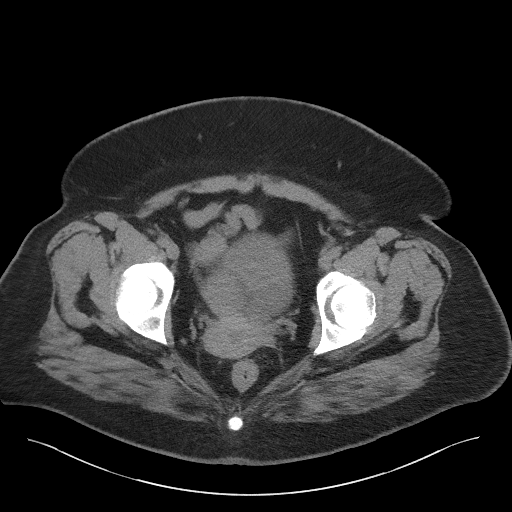
[im 25/92  soft-tissue]
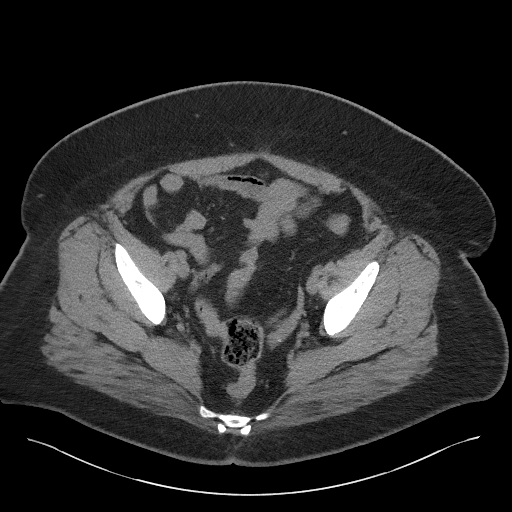
[im 32/92  soft-tissue]
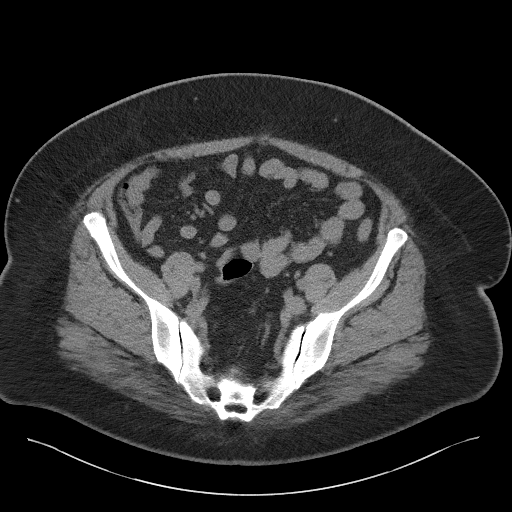
[im 36/92  soft-tissue]
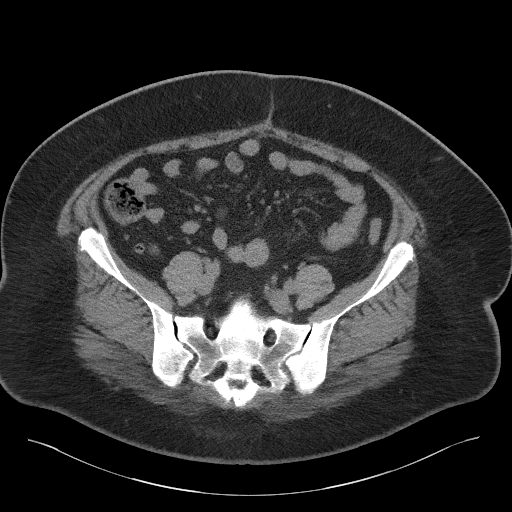
[im 43/92  soft-tissue]
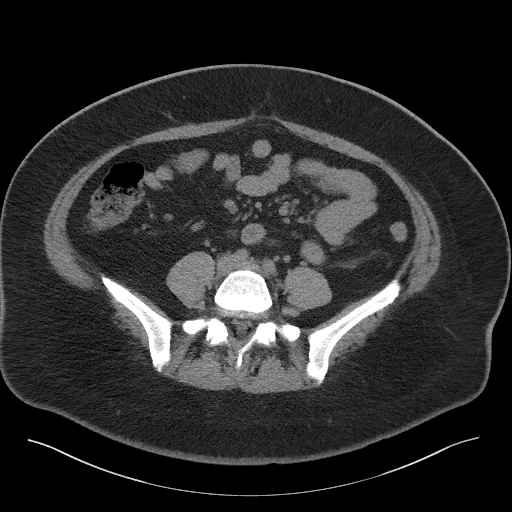
[im 50/92  soft-tissue]
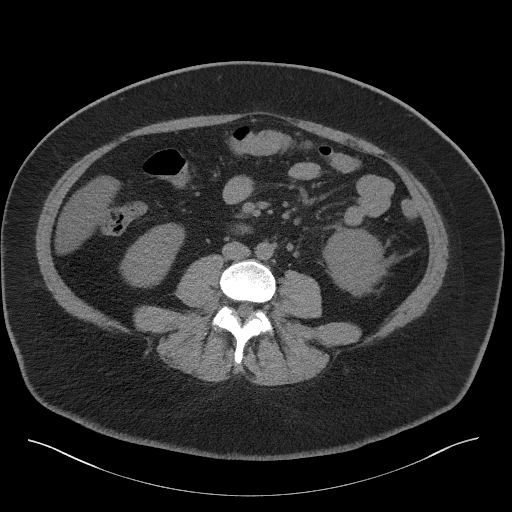
[im 57/92  soft-tissue]
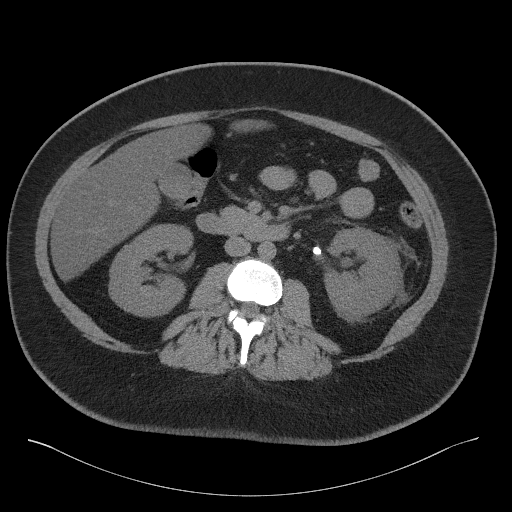
[im 57/92  bone]
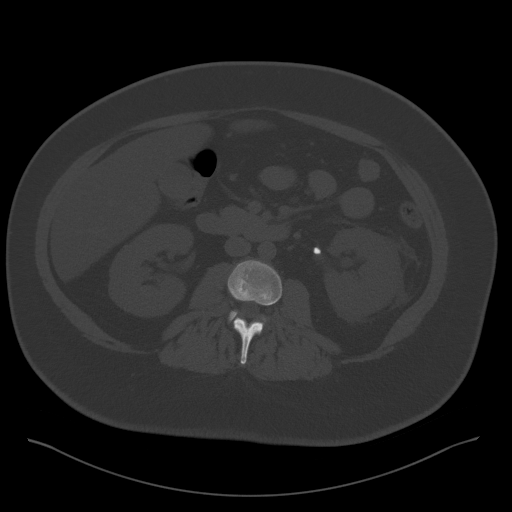
[im 60/92  soft-tissue]
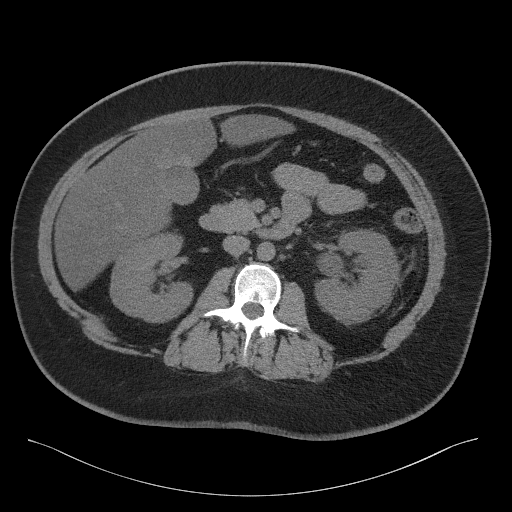
[im 67/92  soft-tissue]
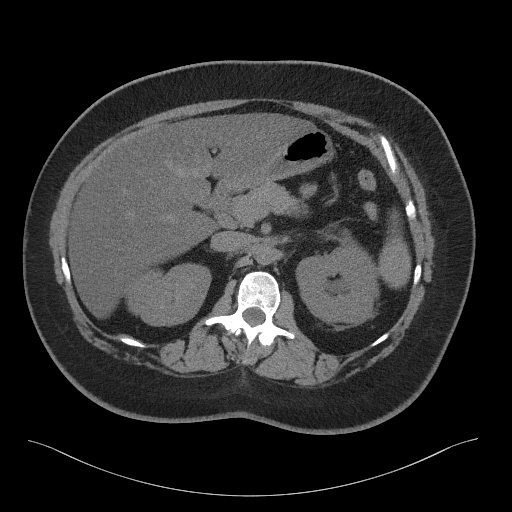
[im 74/92  soft-tissue]
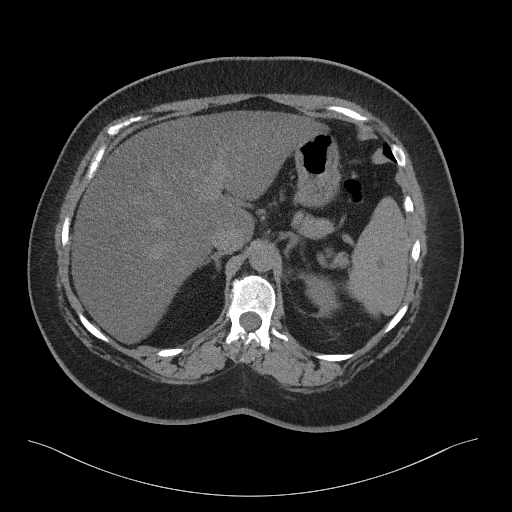
[im 81/92  soft-tissue]
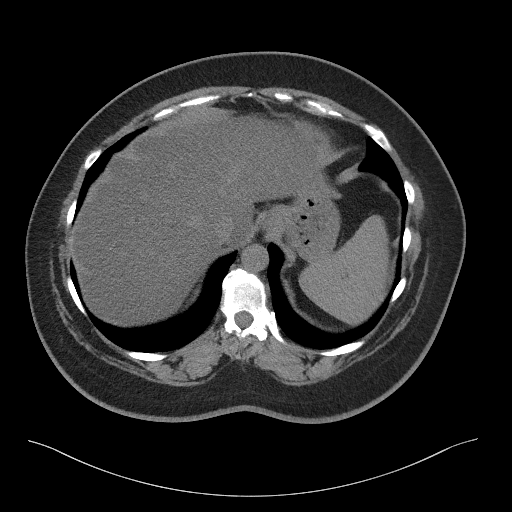
[im 88/92  soft-tissue]
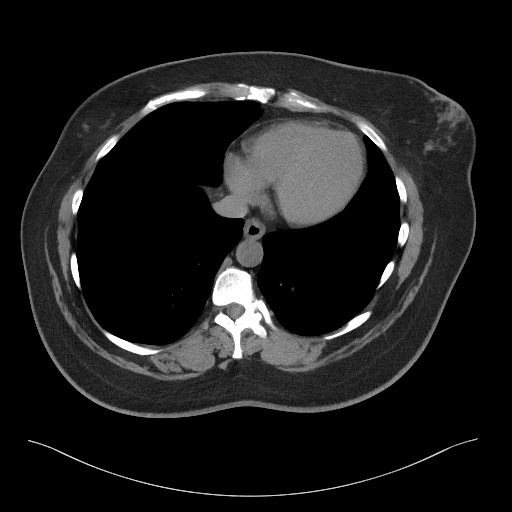

[Series 4: coronal st · coronal · 0.87mm/px · 3 of 109 slices shown]
[im 37/109  soft-tissue]
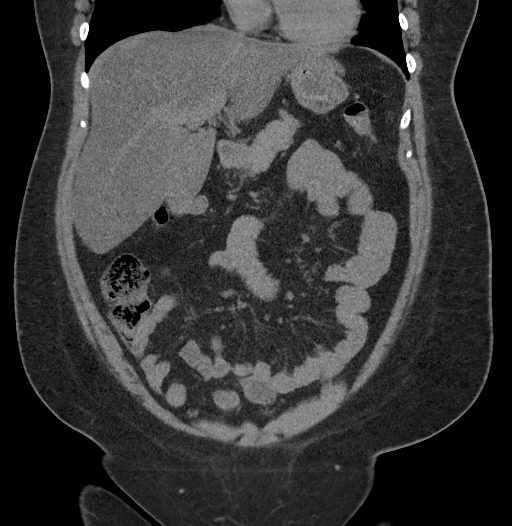
[im 49/109  soft-tissue]
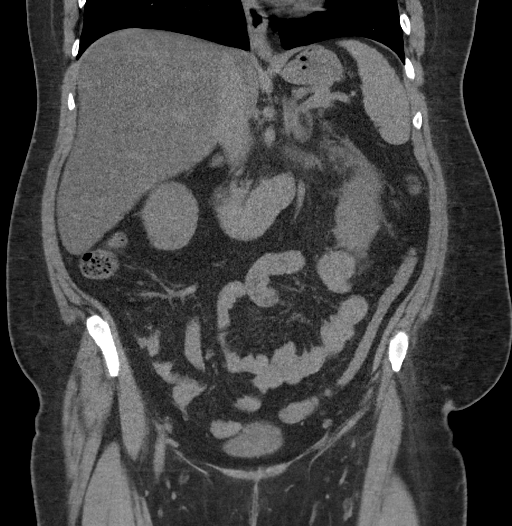
[im 61/109  soft-tissue]
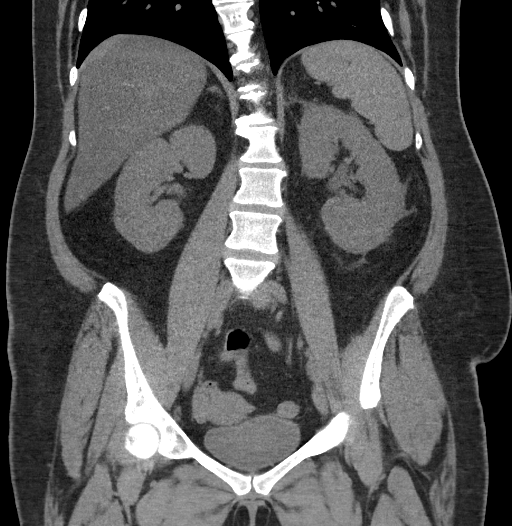

[17 of 46 positions shown; findings below may reference images not displayed]

FINDINGS: Lower chest: No acute abnormality.

Hepatobiliary: There is diffuse fatty infiltration of the liver
parenchyma. No focal liver abnormality is seen. No gallstones,
gallbladder wall thickening, or biliary dilatation.

Pancreas: Unremarkable. No pancreatic ductal dilatation or
surrounding inflammatory changes.

Spleen: Normal in size without focal abnormality.

Adrenals/Urinary Tract: Adrenal glands are unremarkable. Kidneys are
normal in size, without focal lesions. A 7 mm obstructing renal
stone is seen within the proximal left ureter with moderate severity
left-sided hydronephrosis and perinephric inflammatory fat
stranding. A 5 mm nonobstructing renal stone is seen within the
lower pole of the left kidney. Bladder is unremarkable.

Stomach/Bowel: Stomach is within normal limits. Appendix appears
normal. No evidence of bowel wall thickening, distention, or
inflammatory changes.

Vascular/Lymphatic: No significant vascular findings are present. No
enlarged abdominal or pelvic lymph nodes.

Reproductive: Uterus is normal in size and appearance. A 2.7 cm x
2.2 cm cystic appearing area is seen within the left adnexa.

Other: No abdominal wall hernia or abnormality. No abdominopelvic
ascites.

Musculoskeletal: No acute or significant osseous findings.
IMPRESSION: 1.  7 mm obstructing renal stone within the proximal left ureter.
2.  5 mm nonobstructing renal stone within the left kidney.
3.  Left adnexal cyst, likely ovarian in origin.
4. Fatty liver.

## 2022-05-06 ENCOUNTER — Encounter (INDEPENDENT_AMBULATORY_CARE_PROVIDER_SITE_OTHER): Payer: No Typology Code available for payment source | Admitting: Ophthalmology

## 2022-05-23 ENCOUNTER — Encounter (INDEPENDENT_AMBULATORY_CARE_PROVIDER_SITE_OTHER): Payer: Self-pay | Admitting: Ophthalmology

## 2022-05-23 ENCOUNTER — Ambulatory Visit (INDEPENDENT_AMBULATORY_CARE_PROVIDER_SITE_OTHER): Payer: No Typology Code available for payment source | Admitting: Ophthalmology

## 2022-05-23 DIAGNOSIS — E113411 Type 2 diabetes mellitus with severe nonproliferative diabetic retinopathy with macular edema, right eye: Secondary | ICD-10-CM | POA: Diagnosis not present

## 2022-05-23 DIAGNOSIS — H2513 Age-related nuclear cataract, bilateral: Secondary | ICD-10-CM | POA: Insufficient documentation

## 2022-05-23 DIAGNOSIS — E113512 Type 2 diabetes mellitus with proliferative diabetic retinopathy with macular edema, left eye: Secondary | ICD-10-CM

## 2022-05-23 DIAGNOSIS — H43822 Vitreomacular adhesion, left eye: Secondary | ICD-10-CM

## 2022-05-23 NOTE — Assessment & Plan Note (Addendum)
OS with center involved CSME, will likely need repeat Avastin in the near future as this is slightly worse overall by OCT.  NV on the nerve has not progressed.

## 2022-05-23 NOTE — Patient Instructions (Signed)
The nature of diabetic retinopathy was explained using the following analogy: "Retinopathy develops in the body's blood supply like salty water corrodes the linings of pipes in a house, until rust appears, then holes in the pipes develop which leak followed by destruction and loss of the pipes as the corrosion turns them to dust. In a similar fashion, Diabetes damages the blood supply of the body by cumulative long--term elevated blood sugar, which corrodes the blood supply in the body, particularly the blood vessels supplying the retina, kidneys, and nerves".  Thus, control of blood sugar, slows the progression of the corrosive effect of diabetes mellitus.    

## 2022-05-23 NOTE — Assessment & Plan Note (Signed)
Visible on OCT probably not causative of the extent of CSME present OS

## 2022-05-23 NOTE — Assessment & Plan Note (Signed)
OD with center involved CSME much worse now at 3 months post most recent examination and documentation of CSME.  Will need repeat injection in the near future patient would prefer to schedule

## 2022-05-29 ENCOUNTER — Encounter (INDEPENDENT_AMBULATORY_CARE_PROVIDER_SITE_OTHER): Payer: No Typology Code available for payment source | Admitting: Ophthalmology

## 2022-06-27 ENCOUNTER — Encounter (INDEPENDENT_AMBULATORY_CARE_PROVIDER_SITE_OTHER): Payer: Self-pay | Admitting: Ophthalmology

## 2022-06-27 ENCOUNTER — Ambulatory Visit (INDEPENDENT_AMBULATORY_CARE_PROVIDER_SITE_OTHER): Payer: No Typology Code available for payment source | Admitting: Ophthalmology

## 2022-06-27 DIAGNOSIS — E113411 Type 2 diabetes mellitus with severe nonproliferative diabetic retinopathy with macular edema, right eye: Secondary | ICD-10-CM

## 2022-06-27 DIAGNOSIS — E113512 Type 2 diabetes mellitus with proliferative diabetic retinopathy with macular edema, left eye: Secondary | ICD-10-CM | POA: Diagnosis not present

## 2022-06-27 MED ORDER — BEVACIZUMAB CHEMO INJECTION 1.25MG/0.05ML SYRINGE FOR KALEIDOSCOPE
1.2500 mg | INTRAVITREAL | Status: AC | PRN
  Administered 2022-06-27: 1.25 mg via INTRAVITREAL

## 2022-06-27 NOTE — Assessment & Plan Note (Addendum)
Center involved CSME OD commence with therapy Avastin OD today, though curious at 3 weeks after most recent evaluation is diminished on no therapy and coincident with developing systemic hand-foot-and-mouth disease and treatment.

## 2022-06-27 NOTE — Assessment & Plan Note (Signed)
center threatening but not involved CSME.  We will continue to observe OS

## 2022-06-27 NOTE — Progress Notes (Signed)
06/27/2022     CHIEF COMPLAINT Patient presents for  Chief Complaint  Patient presents with   Diabetic Retinopathy with Macular Edema      HISTORY OF PRESENT ILLNESS: Tiffany Mills is a 36 y.o. female who presents to the clinic today for:    Recently patient diagnosed with hand-foot-and-mouth disease treated with systemic Seikaly or acyclovir Referring physician: [name], NP [place],  [place] [phone]  HISTORICAL INFORMATION:   Selected notes from the [facility]: No current outpatient medications on file. (Ophthalmic Drugs)   No current facility-administered medications for this visit. (Ophthalmic Drugs)   Current Outpatient Medications (Other)  Medication Sig   benazepril (LOTENSIN) 5 MG tablet Take 5 mg by mouth daily.   buPROPion (WELLBUTRIN XL) 300 MG 24 hr tablet Take 300 mg by mouth daily. Reported on 02/13/2016   fenofibrate 160 MG tablet Take 160 mg by mouth daily. Reported on 02/13/2016   gemfibrozil (LOPID) 600 MG tablet Take 600 mg by mouth 2 (two) times daily before a meal.   metFORMIN (GLUCOPHAGE) 1000 MG tablet Take 1,000 mg by mouth 2 (two) times daily with a meal.   norgestimate-ethinyl estradiol (SPRINTEC 28) 0.25-35 MG-MCG tablet Take 1 tablet by mouth daily. (Patient not taking: Reported on 02/13/2016)   ondansetron (ZOFRAN ODT) 4 MG disintegrating tablet Take 1 tablet (4 mg total) by mouth every 8 (eight) hours as needed for nausea or vomiting.   sitaGLIPtin (JANUVIA) 100 MG tablet Take 100 mg by mouth daily.   tamsulosin (FLOMAX) 0.4 MG CAPS capsule Take 1 capsule (0.4 mg total) by mouth daily.   No current facility-administered medications for this visit. (Other)      REVIEW OF SYSTEMS: ROS   Negative for: Constitutional, Gastrointestinal, Neurological, Skin, Genitourinary, Musculoskeletal, HENT, Endocrine, Cardiovascular, Eyes, Respiratory, Psychiatric, Allergic/Imm,  Heme/Lymph Last edited by Hurman Horn, MD on 06/27/2022  3:35 PM.       ALLERGIES No Known Allergies  PAST MEDICAL HISTORY Past Medical History:  Diagnosis Date   Depression    Diabetes mellitus    TYPE II   Hypercholesterolemia    Serous cystadenofibroma    OF RIGHT OVARY,, WITH SCATTERED FOCI, SEROUS BODERLINE TUMOR.   Past Surgical History:  Procedure Laterality Date   PELVIC LAPAROSCOPY     RIGHT OVARIAN CYSTECTOMY, PARTIAL OOPHORECTOMY    FAMILY HISTORY Family History  Problem Relation Age of Onset   Diabetes Maternal Grandmother    Hypertension Maternal Grandfather    Cancer Maternal Grandfather        COLON, SKIN    SOCIAL HISTORY Social History   Tobacco Use   Smoking status: Never   Smokeless tobacco: Never  Substance Use Topics   Alcohol use: Not Currently   Drug use: No         OPHTHALMIC EXAM:  Base Eye Exam     Visual Acuity (ETDRS)       Right Left   Dist cc 20/30 -1 20/25    Correction: Glasses         Tonometry (Tonopen, 3:38 PM)       Right Left   Pressure 17 16         Pupils       Pupils   Right PERRL   Left PERRL         Neuro/Psych     Oriented x3: Yes   Mood/Affect: Normal  Slit Lamp and Fundus Exam     External Exam       Right Left   External Normal Normal         Slit Lamp Exam       Right Left   Lids/Lashes Normal Normal   Conjunctiva/Sclera White and quiet White and quiet   Cornea Clear Clear   Anterior Chamber Deep and quiet Deep and quiet   Iris Round and reactive Round and reactive   Lens Trace Nuclear sclerosis Trace Nuclear sclerosis   Anterior Vitreous Normal Normal         Fundus Exam       Right Left   Posterior Vitreous Normal Normal   Disc Normal Neovascularization 10-c size now much smaller, flat, stable since October, less active less visible   C/D Ratio 0.25 0.25   Macula Macular thickening, Microaneurysms, Mild clinically significant macular edema  Macular thickening, Microaneurysms, Mild clinically significant macular edema   Vessels NPDR-Severe, NPDR-Severe, very   Periphery Good peripheral anterior PRP, 360 Good PRP nearly 360, some room superiorly for completion, and nasally            IMAGING AND PROCEDURES  Imaging and Procedures for 06/27/22  OCT, Retina - OU - Both Eyes       Right Eye Quality was good. Progression has improved. Findings include abnormal foveal contour, cystoid macular edema.   Left Eye Quality was good. Scan locations included subfoveal. Progression has worsened. Findings include abnormal foveal contour, cystoid macular edema, vitreous traction, vitreomacular adhesion .   Notes Macular thickening OD temporal to fovea, recently with increased thickening some 11 weeks post most recent evaluation but now at 3 weeks post intended injection slightly improved we will still treat because of center involved CSME  OS with with mild to moderate CSME on the nasal aspect of the FAZ stable OS as compared to 14 weeks previous yet still active, will observe OS      Intravitreal Injection, Pharmacologic Agent - OD - Right Eye       Time Out 06/27/2022. 3:54 PM. Confirmed correct patient, procedure, site, and patient consented.   Anesthesia Topical anesthesia was used. Anesthetic medications included Lidocaine 4%.   Procedure Preparation included 5% betadine to ocular surface, 10% betadine to eyelids, Tobramycin 0.3%. A supplied needle was used.   Injection: 1.25 mg Bevacizumab 1.'25mg'$ /0.11m   Route: Intravitreal, Site: Right Eye   NDC: 5H061816  Post-op Post injection exam found visual acuity of at least counting fingers. The patient tolerated the procedure well. There were no complications. The patient received written and verbal post procedure care education. Post injection medications were not given.              ASSESSMENT/PLAN:  Severe nonproliferative diabetic retinopathy of right  eye, with macular edema, associated with type 2 [facility] commence with therapy Avastin OD today, though curious at 3 weeks after most recent evaluation is diminished on no therapy and coincident with developing systemic hand-foot-and-mouth disease and treatment.  Proliferative diabetic retinopathy of left eye with macular edema associated with type 2 diabetes mellitus ([facility] but not involved CS[facility].  We will continue to observe OS     ICD-10-CM   1. Severe nonproliferative diabetic retinopathy of right eye, with macular edema, associated with type 2 diabetes mellitus (HCC)  E11.3411 OCT, Retina - OU - Both Eyes    Intravitreal Injection, Pharmacologic Agent - OD - Right  Eye    Bevacizumab (AVASTIN) SOLN 1.25 mg    2. Proliferative diabetic retinopathy of left eye with macular edema associated with type 2 diabetes mellitus (HCC)  M25.0037 OCT, Retina - OU - Both Eyes      1.  OD, center involved CSME still active though slightly improved from previous 3 weeks, repeat injection today to protect visual acuity  2.  OS will continue to observe  3.  Ophthalmic Meds Ordered this visit:  Meds ordered this encounter  Medications   Bevacizumab (AVASTIN) SOLN 1.25 mg       Return in about 6 weeks (around 08/08/2022) for DILATE OU, AVASTIN OCT, OD.  There are no Patient Instructions on file for this visit.   Explained the diagnoses, plan, and follow up with the patient and they expressed understanding.  Patient expressed understanding of the importance of proper follow up care.   Clent Demark Herma Uballe M.D. Diseases & Surgery of the Retina and Vitreous Retina & Diabetic [employer] 06/27/22     Abbreviations: M myopia (nearsighted); A astigmatism; H hyperopia (farsighted); P presbyopia; Mrx spectacle prescription;  CTL contact lenses; OD right eye; OS left eye; OU both eyes  XT exotropia; ET esotropia; PEK punctate epithelial keratitis;  PEE punctate epithelial erosions; DES dry eye syndrome; MGD meibomian gland dysfunction; ATs artificial tears; PFAT's preservative free artificial tears; [place] nuclear sclerotic cataract; PSC posterior subcapsular cataract; ERM epi-retinal membrane; PVD posterior vitreous detachment; RD retinal detachment; DM diabetes mellitus; DR diabetic retinopathy; NPDR non-proliferative diabetic retinopathy; PDR proliferative diabetic retinopathy; CSME clinically significant macular edema; DME diabetic macular edema; dbh dot blot hemorrhages; CWS cotton wool spot; POAG primary open angle glaucoma; C/D cup-to-disc ratio; HVF humphrey visual field; GVF goldmann visual field; OCT optical coherence tomography; IOP intraocular pressure; BRVO Branch retinal vein occlusion; CRVO central retinal vein occlusion; CRAO central retinal artery occlusion; BRAO branch retinal artery occlusion; RT retinal tear; SB scleral buckle; PPV pars plana vitrectomy; VH Vitreous hemorrhage; PRP panretinal laser photocoagulation; IVK intravitreal kenalog; VMT vitreomacular traction; MH Macular hole;  NVD neovascularization of the disc; NVE neovascularization elsewhere; AREDS age related eye disease study; ARMD age related macular degeneration; POAG primary open angle glaucoma; EBMD epithelial/anterior basement membrane dystrophy; ACIOL anterior chamber intraocular lens; IOL intraocular lens; PCIOL posterior chamber intraocular lens; Phaco/IOL phacoemulsification with intraocular lens placement; [place] photorefractive keratectomy; LASIK laser assisted in situ keratomileusis; HTN hypertension; DM diabetes mellitus; COPD chronic obstructive pulmonary disease

## 2022-08-12 ENCOUNTER — Ambulatory Visit (INDEPENDENT_AMBULATORY_CARE_PROVIDER_SITE_OTHER): Payer: No Typology Code available for payment source | Admitting: Ophthalmology

## 2022-08-12 ENCOUNTER — Encounter (INDEPENDENT_AMBULATORY_CARE_PROVIDER_SITE_OTHER): Payer: Self-pay | Admitting: Ophthalmology

## 2022-08-12 DIAGNOSIS — E113411 Type 2 diabetes mellitus with severe nonproliferative diabetic retinopathy with macular edema, right eye: Secondary | ICD-10-CM

## 2022-08-12 DIAGNOSIS — H2513 Age-related nuclear cataract, bilateral: Secondary | ICD-10-CM

## 2022-08-12 DIAGNOSIS — E113512 Type 2 diabetes mellitus with proliferative diabetic retinopathy with macular edema, left eye: Secondary | ICD-10-CM

## 2022-08-12 MED ORDER — BEVACIZUMAB CHEMO INJECTION 1.25MG/0.05ML SYRINGE FOR KALEIDOSCOPE
1.2500 mg | INTRAVITREAL | Status: AC | PRN
  Administered 2022-08-12: 1.25 mg via INTRAVITREAL

## 2022-08-12 NOTE — Assessment & Plan Note (Signed)
OS center involved CSME persists.  We will repeat injection today follow-up again in 7 weeks

## 2022-08-12 NOTE — Patient Instructions (Signed)
Patient to continue best efforts of blood sugar and blood pressure control and monitoring

## 2022-08-12 NOTE — Assessment & Plan Note (Signed)
OS doing well.  No sign of recurrence of CSME.

## 2022-08-12 NOTE — Assessment & Plan Note (Signed)
Stable overall.  

## 2022-08-12 NOTE — Progress Notes (Signed)
08/12/2022     CHIEF COMPLAINT Patient presents for No chief complaint on file.     HISTORY OF PRESENT ILLNESS: Tiffany Mills is a 36 y.o. female who presents to the clinic today for:   HPI   6 week dilate ou avastin oct od Pt states her vision has been stable Pt denies any new floaters or FOL  Last edited by Morene Rankins, CMA on 08/12/2022  4:24 PM.      Referring physician: [name], NP [place],  [place] [phone]  HISTORICAL INFORMATION:   Selected notes from the [facility]: No current outpatient medications on file. (Ophthalmic Drugs)   No current facility-administered medications for this visit. (Ophthalmic Drugs)   Current Outpatient Medications (Other)  Medication Sig   benazepril (LOTENSIN) 5 MG tablet Take 5 mg by mouth daily.   buPROPion (WELLBUTRIN XL) 300 MG 24 hr tablet Take 300 mg by mouth daily. Reported on 02/13/2016   fenofibrate 160 MG tablet Take 160 mg by mouth daily. Reported on 02/13/2016   gemfibrozil (LOPID) 600 MG tablet Take 600 mg by mouth 2 (two) times daily before a meal.   metFORMIN (GLUCOPHAGE) 1000 MG tablet Take 1,000 mg by mouth 2 (two) times daily with a meal.   norgestimate-ethinyl estradiol (SPRINTEC 28) 0.25-35 MG-MCG tablet Take 1 tablet by mouth daily. (Patient not taking: Reported on 02/13/2016)   ondansetron (ZOFRAN ODT) 4 MG disintegrating tablet Take 1 tablet (4 mg total) by mouth every 8 (eight) hours as needed for nausea or vomiting.   sitaGLIPtin (JANUVIA) 100 MG tablet Take 100 mg by mouth daily.   tamsulosin (FLOMAX) 0.4 MG CAPS capsule Take 1 capsule (0.4 mg total) by mouth daily.   No current facility-administered medications for this visit. (Other)      REVIEW OF SYSTEMS: ROS   Negative for: Constitutional, Gastrointestinal, Neurological, Skin, Genitourinary, Musculoskeletal, HENT, Endocrine, Cardiovascular, Eyes, Respiratory, Psychiatric,  Allergic/Imm, Heme/Lymph Last edited by Morene Rankins, CMA on 08/12/2022  4:24 PM.       ALLERGIES No Known Allergies  PAST MEDICAL HISTORY Past Medical History:  Diagnosis Date   Depression    Diabetes mellitus    TYPE II   Hypercholesterolemia    Serous cystadenofibroma    OF RIGHT OVARY,, WITH SCATTERED FOCI, SEROUS BODERLINE TUMOR.   Past Surgical History:  Procedure Laterality Date   PELVIC LAPAROSCOPY     RIGHT OVARIAN CYSTECTOMY, PARTIAL OOPHORECTOMY    FAMILY HISTORY Family History  Problem Relation Age of Onset   Diabetes Maternal Grandmother    Hypertension Maternal Grandfather    Cancer Maternal Grandfather        COLON, SKIN    SOCIAL HISTORY Social History   Tobacco Use   Smoking status: Never   Smokeless tobacco: Never  Substance Use Topics   Alcohol use: Not Currently   Drug use: No         OPHTHALMIC EXAM:  Base Eye Exam     Visual Acuity (ETDRS)       Right Left   Dist cc 20/40 20/25 -1    Correction: Glasses         Tonometry (Tonopen, 4:30 PM)       Right Left   Pressure 15 10         Pupils       Pupils   Right PERRL   Left PERRL  Visual Fields       Left Right    Full Full         Extraocular Movement       Right Left    Ortho Ortho    -- -- --  --  --  -- -- --   -- -- --  --  --  -- -- --           Neuro/Psych     Oriented x3: Yes   Mood/Affect: Normal         Dilation     Right eye: 2.5% Phenylephrine, 1.0% Mydriacyl @ 4:26 PM           Slit Lamp and Fundus Exam     External Exam       Right Left   External Normal Normal         Slit Lamp Exam       Right Left   Lids/Lashes Normal Normal   Conjunctiva/Sclera White and quiet White and quiet   Cornea Clear Clear   Anterior Chamber Deep and quiet Deep and quiet   Iris Round and reactive Round and reactive   Lens Trace Nuclear sclerosis Trace Nuclear sclerosis   Anterior Vitreous Normal Normal          Fundus Exam       Right Left   Posterior Vitreous Normal Normal   Disc Normal Neovascularization 10-c size now much smaller, flat, stable since October, less active less visible   C/D Ratio 0.25 0.25   Macula Macular thickening, Microaneurysms, Mild clinically significant macular edema Macular thickening, Microaneurysms, Mild clinically significant macular edema   Vessels NPDR-Severe, NPDR-Severe, very   Periphery Good peripheral anterior PRP, 360 Good PRP nearly 360, some room superiorly for completion, and nasally            IMAGING AND PROCEDURES  Imaging and Procedures for 08/12/22  OCT, Retina - OU - Both Eyes       Right Eye Quality was good. Central Foveal Thickness: 494. Progression has worsened. Findings include abnormal foveal contour, cystoid macular edema.   Left Eye Quality was good. Scan locations included subfoveal. Central Foveal Thickness: 317. Progression has improved. Findings include abnormal foveal contour, cystoid macular edema, vitreous traction, vitreomacular adhesion .   Notes Macular thickening OD temporal to fovea, recently with increased thickening some 7 weeks post most recent evaluation but now at 7 weeks post intended injection slightly improved we will still treat because of center involved CSME  OS with with mild to moderate CSME on the nasal aspect of the FAZ stable OS as compared to 5 months ago when last treated      Intravitreal Injection, Pharmacologic Agent - OD - Right Eye       Time Out 08/12/2022. 4:51 PM. Confirmed correct patient, procedure, site, and patient consented.   Anesthesia Topical anesthesia was used. Anesthetic medications included Lidocaine 4%.   Procedure Preparation included 5% betadine to ocular surface, 10% betadine to eyelids, Tobramycin 0.3%. A supplied needle was used.   Injection: 1.25 mg Bevacizumab 1.'25mg'$ /0.72m   Route: Intravitreal, Site: Right Eye   NDC: 5H061816  Post-op Post injection  exam found visual acuity of at least counting fingers. The patient tolerated the procedure well. There were no complications. The patient received written and verbal post procedure care education. Post injection medications were not given.              ASSESSMENT/PLAN:  Severe nonproliferative  diabetic retinopathy of right eye, with macular edema, associated with type 2 diabetes mellitus ([place]) OS center involved CSME persists.  We will repeat injection today follow-up again in 7 weeks  Proliferative diabetic retinopathy of left eye with macular edema associated with type 2 diabetes mellitus ([place]) OS doing well.  No sign of recurrence of CSME.  Nuclear sclerotic cataract of both eyes Stable overall     ICD-10-CM   1. Severe nonproliferative diabetic retinopathy of right eye, with macular edema, associated with type 2 diabetes mellitus (HCC)  E11.3411 OCT, Retina - OU - Both Eyes    Intravitreal Injection, Pharmacologic Agent - OD - Right Eye    Bevacizumab (AVASTIN) SOLN 1.25 mg    2. Proliferative diabetic retinopathy of left eye with macular edema associated with type 2 diabetes mellitus ([place])  M84.1324     3. Nuclear sclerotic cataract of both eyes  H25.13       1.  OD with persistent center involved CSME.  Good acuity.  We will repeat injection today and maintain follow-up evaluation 7 weeks  2.  OS doing well quiet PDR.  3.  Ophthalmic Meds Ordered this visit:  Meds ordered this encounter  Medications   Bevacizumab (AVASTIN) SOLN 1.25 mg       Return in about 7 weeks (around 09/30/2022) for dilate, OD, AVASTIN OCT.  Patient Instructions  Patient to continue best efforts of blood sugar and blood pressure control and monitoring   Explained the diagnoses, plan, and follow up with the patient and they expressed understanding.  Patient expressed understanding of the importance of proper follow up care.   Clent Demark Naiara Lombardozzi M.D. Diseases & Surgery of the Retina and  Vitreous Retina & Diabetic [place] 08/12/22     Abbreviations: M myopia (nearsighted); A astigmatism; H hyperopia (farsighted); P presbyopia; Mrx spectacle prescription;  CTL contact lenses; OD right eye; OS left eye; OU both eyes  XT exotropia; ET esotropia; PEK punctate epithelial keratitis; PEE punctate epithelial erosions; DES dry eye syndrome; MGD meibomian gland dysfunction; ATs artificial tears; PFAT's preservative free artificial tears; [place] nuclear sclerotic cataract; PSC posterior subcapsular cataract; ERM epi-retinal membrane; PVD posterior vitreous detachment; RD retinal detachment; DM diabetes mellitus; DR diabetic retinopathy; NPDR non-proliferative diabetic retinopathy; PDR proliferative diabetic retinopathy; CSME clinically significant macular edema; DME diabetic macular edema; dbh dot blot hemorrhages; CWS cotton wool spot; POAG primary open angle glaucoma; C/D cup-to-disc ratio; HVF humphrey visual field; GVF goldmann visual field; OCT optical coherence tomography; IOP intraocular pressure; BRVO Branch retinal vein occlusion; CRVO central retinal vein occlusion; CRAO central retinal artery occlusion; BRAO branch retinal artery occlusion; RT retinal tear; SB scleral buckle; PPV pars plana vitrectomy; VH Vitreous hemorrhage; PRP panretinal laser photocoagulation; IVK intravitreal kenalog; VMT vitreomacular traction; MH Macular hole;  NVD neovascularization of the disc; NVE neovascularization elsewhere; AREDS age related eye disease study; ARMD age related macular degeneration; POAG primary open angle glaucoma; EBMD epithelial/anterior basement membrane dystrophy; ACIOL anterior chamber intraocular lens; IOL intraocular lens; PCIOL posterior chamber intraocular lens; Phaco/IOL phacoemulsification with intraocular lens placement; [place] photorefractive keratectomy; LASIK laser assisted in situ keratomileusis; HTN hypertension; DM diabetes mellitus; COPD chronic obstructive pulmonary disease

## 2022-09-30 ENCOUNTER — Encounter (INDEPENDENT_AMBULATORY_CARE_PROVIDER_SITE_OTHER): Payer: No Typology Code available for payment source | Admitting: Ophthalmology

## 2024-02-17 ENCOUNTER — Institutional Professional Consult (permissible substitution) (HOSPITAL_BASED_OUTPATIENT_CLINIC_OR_DEPARTMENT_OTHER): Payer: No Typology Code available for payment source | Admitting: Internal Medicine

## 2024-08-21 ENCOUNTER — Ambulatory Visit: Admission: EM | Admit: 2024-08-21 | Discharge: 2024-08-21 | Disposition: A | Discharge status: Home / Self Care

## 2024-08-21 ENCOUNTER — Other Ambulatory Visit: Payer: Self-pay

## 2024-08-21 ENCOUNTER — Encounter: Payer: Self-pay | Admitting: Emergency Medicine

## 2024-08-21 DIAGNOSIS — M79672 Pain in left foot: Secondary | ICD-10-CM

## 2024-08-21 DIAGNOSIS — S90852A Superficial foreign body, left foot, initial encounter: Secondary | ICD-10-CM | POA: Diagnosis not present

## 2024-08-21 MED ORDER — MUPIROCIN 2 % EX OINT: 1.0000 | TOPICAL_OINTMENT | Freq: Two times a day (BID) | CUTANEOUS | 0 refills | Status: AC

## 2024-08-21 MED ORDER — BACITRACIN 500 UNIT/GM EX OINT
1.0000 | TOPICAL_OINTMENT | Freq: Two times a day (BID) | CUTANEOUS | Status: DC
  Administered 2024-08-21: 1 via TOPICAL

## 2024-08-21 MED ORDER — CEPHALEXIN 500 MG PO CAPS: 500.0000 mg | ORAL_CAPSULE | Freq: Two times a day (BID) | ORAL | 0 refills | Status: AC

## 2024-08-21 MED ORDER — CHLORHEXIDINE GLUCONATE 4 % EX SOLN: Freq: Every day | CUTANEOUS | 0 refills | Status: AC | PRN

## 2024-08-21 NOTE — ED Triage Notes (Addendum)
 Pt reports stepped on a piece of glass this morning while getting ready and reports I can't get it out. Small piece of glass noted to left heel. Bleeding controlled. Has not tried anything otc for discomfort. Has cleaned site with peroxide. Last tetanus shot unknown.

## 2024-08-21 NOTE — Discharge Instructions (Signed)
 We have removed the glass fragment today and cleaned, dressed the area.  Clean the area at least once a day with Hibiclens  and apply the mupirocin  ointment and a nonstick dressing.  Take the course of antibiotics to keep the area from getting infected.  You may elevate at rest, take over-the-counter pain relievers as needed and do warm Epsom salt soaks.  Follow-up for worsening or unresolving symptoms.

## 2024-08-21 NOTE — ED Notes (Signed)
 Site soaking in Hibiclens and sterile water. Pt tolerating well.

## 2024-08-25 NOTE — ED Provider Notes (Signed)
 [facility]    CSN: [id number] Arrival date & time: 08/21/24  1052      History   Chief Complaint Chief Complaint  Patient presents with   Foreign Body    HPI Tiffany Mills is a 38 y.o. female.   Patient presenting today with a shard of glass to the left heel that occurred this morning when she accidentally stepped on it.  She tried to get it out herself at home but was unable to.  Denies loss of range of motion, numbness, tingling.  She is unsure when her last tetanus shot was.    Past Medical History:  Diagnosis Date   Depression    Diabetes mellitus    TYPE II   Hypercholesterolemia    Serous cystadenofibroma    OF RIGHT OVARY,, WITH SCATTERED FOCI, SEROUS BODERLINE TUMOR.    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 (HCC) 08/17/2020   Severe nonproliferative diabetic retinopathy of right eye, with macular edema, associated with type 2 diabetes mellitus (HCC) 08/17/2020   Ovarian cystadenoma 07/09/2012   Diabetes mellitus, type II (HCC) 07/09/2012   HTN (hypertension) 07/09/2012   Hypercholesterolemia 07/09/2012    Past Surgical History:  Procedure Laterality Date   PELVIC LAPAROSCOPY     RIGHT OVARIAN CYSTECTOMY, PARTIAL OOPHORECTOMY    OB History     Gravida  0   Para      Term      Preterm      AB      Living         SAB      IAB      Ectopic      Multiple      Live Births               Home Medications    Prior to Admission medications   Medication Sig Start Date End Date Taking? Authorizing Provider  cephALEXin  (KEFLEX ) 500 MG capsule Take 1 capsule (500 mg total) by mouth 2 (two) times daily. 08/21/24  Yes Stuart Vernell Norris, PA-C  chlorhexidine  (HIBICLENS ) 4 % external liquid Apply topically  daily as needed. 08/21/24  Yes Stuart Vernell Norris, PA-C  desvenlafaxine (PRISTIQ) 50 MG 24 hr tablet Take 50 mg by mouth daily.   Yes [provider]  mupirocin  ointment (BACTROBAN ) 2 % Apply 1 Application topically 2 (two) times daily. 08/21/24  Yes Stuart Vernell Norris, PA-C  benazepril (LOTENSIN) 5 MG tablet Take 5 mg by mouth daily.    [provider]  buPROPion (WELLBUTRIN XL) 300 MG 24 hr tablet Take 300 mg by mouth daily. Reported on 02/13/2016    [provider]  fenofibrate 160 MG tablet Take 160 mg by mouth daily. Reported on 02/13/2016    [provider]  gemfibrozil (LOPID) 600 MG tablet Take 600 mg by mouth 2 (two) times daily before a meal.    [provider]  metFORMIN (GLUCOPHAGE) 1000 MG tablet Take 1,000 mg by mouth 2 (two) times daily with a meal.    [provider]  norgestimate -ethinyl estradiol  (SPRINTEC 28) 0.25-35 MG-MCG tablet Take 1 tablet by mouth daily. Patient not taking: Reported on 02/13/2016 07/16/13   Winfred Curlee DEL, MD  ondansetron  (ZOFRAN  ODT) 4 MG disintegrating tablet Take 1 tablet (4 mg  total) by mouth every 8 (eight) hours as needed for nausea or vomiting. 10/28/20   Little, Vernell Search, MD  sitaGLIPtin (JANUVIA) 100 MG tablet Take 100 mg by mouth daily.    [provider]  tamsulosin  (FLOMAX ) 0.4 MG CAPS capsule Take 1 capsule (0.4 mg total) by mouth daily. 10/28/20   Little, Vernell Search, MD    Family History Family History  Problem Relation Age of Onset   Diabetes Maternal Grandmother    Hypertension Maternal Grandfather    Cancer Maternal Grandfather        COLON, SKIN    Social History Social History   Tobacco Use   Smoking status: Never   Smokeless tobacco: Never  Substance Use Topics   Alcohol use: Not Currently   Drug use: No     Allergies   Patient has no known allergies.   Review of Systems Review of Systems Per HPI  Physical Exam Triage Vital Signs ED  Triage Vitals  Encounter Vitals Group     BP 08/21/24 1056 (!) 172/102     Girls Systolic BP Percentile --      Girls Diastolic BP Percentile --      Boys Systolic BP Percentile --      Boys Diastolic BP Percentile --      Pulse Rate 08/21/24 1056 (!) 120     Resp 08/21/24 1056 20     Temp 08/21/24 1056 98.5 F (36.9 C)     Temp Source 08/21/24 1056 Oral     SpO2 08/21/24 1056 97 %     Weight --      Height --      Head Circumference --      Peak Flow --      Pain Score 08/21/24 1101 0     Pain Loc --      Pain Education --      Exclude from Growth Chart --    No data found.  Updated Vital Signs BP (!) 172/102 (BP Location: Right Arm) Comment: x2 attempts. NAD noted.  Pulse (!) 120   Temp 98.5 F (36.9 C) (Oral)   Resp 20   LMP 07/25/2024 (Approximate)   SpO2 97%   Visual Acuity Right Eye Distance:   Left Eye Distance:   Bilateral Distance:    Right Eye Near:   Left Eye Near:    Bilateral Near:     Physical Exam Vitals and nursing note reviewed.  Constitutional:      Appearance: Normal appearance. She is not ill-appearing.  HENT:     Head: Atraumatic.  Eyes:     Extraocular Movements: Extraocular movements intact.     Conjunctiva/sclera: Conjunctivae normal.  Cardiovascular:     Rate and Rhythm: Normal rate.  Pulmonary:     Effort: Pulmonary effort is normal.  Musculoskeletal:        General: Normal range of motion.     Cervical back: Normal range of motion and neck supple.  Skin:    General: Skin is warm.     Comments: Glass shard visible to left heel  Neurological:     Mental Status: She is alert and oriented to person, place, and time.     Comments: Left lower extremity neurovascularly intact  Psychiatric:        Mood and Affect: Mood normal.        Thought Content: Thought content normal.        Judgment: Judgment normal.  UC Treatments / Results  Labs (all labs ordered are listed, but only abnormal results are displayed) Labs  Reviewed - No data to display  EKG   Radiology No results found.  Procedures Procedures (including critical care time)  Medications Ordered in UC Medications - No data to display  Initial Impression / Assessment and Plan / UC Course  I have reviewed the triage vital signs and the nursing notes.  Pertinent labs & imaging results that were available during my care of the patient were reviewed by me and considered in my medical decision making (see chart for details).     Foot was soaked in Hibiclens  and sterile water, forceps were used to remove the entire glass shard from the left heel without complication.  Wound was cleaned, dressed and patient started on Keflex , Bactroban , Hibiclens .  Discussed supportive over-the-counter medications, home care and return precautions.  Final Clinical Impressions(s) / UC Diagnoses   Final diagnoses:  Left foot pain  Foreign body in left foot, initial encounter     Discharge Instructions      We have removed the glass fragment today and cleaned, dressed the area.  Clean the area at least once a day with Hibiclens  and apply the mupirocin  ointment and a nonstick dressing.  Take the course of antibiotics to keep the area from getting infected.  You may elevate at rest, take over-the-counter pain relievers as needed and do warm Epsom salt soaks.  Follow-up for worsening or unresolving symptoms.    ED Prescriptions     Medication Sig Dispense Auth. Provider   cephALEXin  (KEFLEX ) 500 MG capsule Take 1 capsule (500 mg total) by mouth 2 (two) times daily. 14 capsule Stuart Vernell Norris, PA-C   mupirocin  ointment (BACTROBAN ) 2 % Apply 1 Application topically 2 (two) times daily. 60 g Stuart Vernell Norris, PA-C   chlorhexidine  (HIBICLENS ) 4 % external liquid Apply topically daily as needed. 236 mL Stuart Vernell Norris, [place]      PDMP not reviewed this encounter.   Stuart Vernell Norris, PA-C 08/25/24 1520
